# Patient Record
Sex: Female | Born: 1985 | Race: White | Hispanic: No | Marital: Married | Smoking: Never smoker
Health system: Southern US, Community
[De-identification: ages and names within clinical notes are randomized; demographics above are authoritative.]

## PROBLEM LIST (undated history)

## (undated) DIAGNOSIS — N179 Acute kidney failure, unspecified: Secondary | ICD-10-CM

## (undated) DIAGNOSIS — F419 Anxiety disorder, unspecified: Secondary | ICD-10-CM

## (undated) DIAGNOSIS — E079 Disorder of thyroid, unspecified: Secondary | ICD-10-CM

## (undated) DIAGNOSIS — N83209 Unspecified ovarian cyst, unspecified side: Secondary | ICD-10-CM

## (undated) DIAGNOSIS — F319 Bipolar disorder, unspecified: Secondary | ICD-10-CM

## (undated) DIAGNOSIS — F32A Depression, unspecified: Secondary | ICD-10-CM

## (undated) DIAGNOSIS — F329 Major depressive disorder, single episode, unspecified: Secondary | ICD-10-CM

## (undated) DIAGNOSIS — G43909 Migraine, unspecified, not intractable, without status migrainosus: Secondary | ICD-10-CM

## (undated) DIAGNOSIS — E0500 Thyrotoxicosis with diffuse goiter without thyrotoxic crisis or storm: Secondary | ICD-10-CM

## (undated) HISTORY — PX: THYROID SURGERY: SHX805

## (undated) HISTORY — PX: FRACTURE SURGERY: SHX138

## (undated) HISTORY — PX: DILATION AND CURETTAGE OF UTERUS: SHX78

## (undated) HISTORY — PX: ANKLE SURGERY: SHX546

---

## 2010-04-14 ENCOUNTER — Ambulatory Visit: Payer: Self-pay | Admitting: Unknown Physician Specialty

## 2010-10-07 ENCOUNTER — Inpatient Hospital Stay: Payer: Self-pay | Admitting: Internal Medicine

## 2010-12-02 ENCOUNTER — Emergency Department (HOSPITAL_COMMUNITY)
Admission: EM | Admit: 2010-12-02 | Discharge: 2010-12-02 | Payer: Self-pay | Source: Home / Self Care | Admitting: Emergency Medicine

## 2011-01-03 ENCOUNTER — Encounter: Payer: Self-pay | Admitting: Family Medicine

## 2011-01-07 ENCOUNTER — Observation Stay: Payer: Self-pay | Admitting: Specialist

## 2011-01-22 ENCOUNTER — Ambulatory Visit: Payer: Self-pay | Admitting: Cardiovascular Disease

## 2011-02-12 ENCOUNTER — Ambulatory Visit: Payer: Self-pay | Admitting: Internal Medicine

## 2011-03-12 ENCOUNTER — Ambulatory Visit: Payer: Self-pay | Admitting: Gastroenterology

## 2011-03-17 LAB — PATHOLOGY REPORT

## 2012-01-11 ENCOUNTER — Inpatient Hospital Stay: Payer: Self-pay | Admitting: Psychiatry

## 2012-01-11 LAB — HEPATIC FUNCTION PANEL A (ARMC)
Albumin: 4.2 g/dL (ref 3.4–5.0)
Alkaline Phosphatase: 66 U/L (ref 50–136)
SGOT(AST): 32 U/L (ref 15–37)
SGPT (ALT): 33 U/L
Total Protein: 8.8 g/dL — ABNORMAL HIGH (ref 6.4–8.2)

## 2012-01-11 LAB — CBC WITH DIFFERENTIAL/PLATELET
Basophil %: 0.7 %
Eosinophil %: 2.2 %
HCT: 41.5 % (ref 35.0–47.0)
HGB: 13.9 g/dL (ref 12.0–16.0)
Lymphocyte #: 2.6 10*3/uL (ref 1.0–3.6)
MCHC: 33.4 g/dL (ref 32.0–36.0)
Monocyte #: 0.4 10*3/uL (ref 0.0–0.7)
RDW: 13.6 % (ref 11.5–14.5)
WBC: 9 10*3/uL (ref 3.6–11.0)

## 2012-01-11 LAB — DRUG SCREEN, URINE
Amphetamines, Ur Screen: NEGATIVE (ref ?–1000)
Benzodiazepine, Ur Scrn: NEGATIVE (ref ?–200)
MDMA (Ecstasy)Ur Screen: NEGATIVE (ref ?–500)
Opiate, Ur Screen: NEGATIVE (ref ?–300)
Phencyclidine (PCP) Ur S: NEGATIVE (ref ?–25)
Tricyclic, Ur Screen: NEGATIVE (ref ?–1000)

## 2012-01-11 LAB — T4, FREE: Free Thyroxine: 0.24 ng/dL — ABNORMAL LOW (ref 0.76–1.46)

## 2012-01-11 LAB — BASIC METABOLIC PANEL
Anion Gap: 9 (ref 7–16)
Co2: 31 mmol/L (ref 21–32)
EGFR (Non-African Amer.): >60
Osmolality: 282 (ref 275–301)
Potassium: 4.3 mmol/L (ref 3.5–5.1)
Sodium: 142 mmol/L (ref 136–145)

## 2012-01-11 LAB — PREGNANCY, URINE: Pregnancy Test, Urine: NEGATIVE m[IU]/mL

## 2012-01-11 LAB — TSH: Thyroid Stimulating Horm: >100 u[IU]/mL — ABNORMAL HIGH

## 2012-01-11 LAB — ETHANOL: Ethanol: <3 mg/dL

## 2012-01-12 LAB — URINALYSIS, COMPLETE
Blood: NEGATIVE
Glucose,UR: NEGATIVE mg/dL (ref 0–75)
Nitrite: NEGATIVE
Ph: 6 (ref 4.5–8.0)
Protein: NEGATIVE
Specific Gravity: 1.013 (ref 1.003–1.030)
WBC UR: 3 /HPF (ref 0–5)

## 2012-01-12 LAB — BEHAVIORAL MEDICINE 1 PANEL
Alkaline Phosphatase: 64 U/L (ref 50–136)
Basophil #: 0.1 10*3/uL (ref 0.0–0.1)
Bilirubin,Total: 0.4 mg/dL (ref 0.2–1.0)
Calcium, Total: 8.9 mg/dL (ref 8.5–10.1)
Co2: 30 mmol/L (ref 21–32)
Creatinine: 1.12 mg/dL (ref 0.60–1.30)
EGFR (African American): >60
EGFR (Non-African Amer.): >60
Eosinophil #: 0.3 10*3/uL (ref 0.0–0.7)
Glucose: 79 mg/dL (ref 65–99)
HCT: 44.8 % (ref 35.0–47.0)
HGB: 14.9 g/dL (ref 12.0–16.0)
Lymphocyte #: 3 10*3/uL (ref 1.0–3.6)
Lymphocyte %: 38.5 %
MCV: 88 fL (ref 80–100)
Monocyte #: 0.4 10*3/uL (ref 0.0–0.7)
Neutrophil #: 4 10*3/uL (ref 1.4–6.5)
RBC: 5.11 10*6/uL (ref 3.80–5.20)
RDW: 14.1 % (ref 11.5–14.5)
SGOT(AST): 33 U/L (ref 15–37)
SGPT (ALT): 34 U/L
Sodium: 139 mmol/L (ref 136–145)
WBC: 7.8 10*3/uL (ref 3.6–11.0)

## 2012-01-21 LAB — T4, FREE: Free Thyroxine: 0.86 ng/dL (ref 0.76–1.46)

## 2012-01-21 LAB — TSH: Thyroid Stimulating Horm: 64.1 u[IU]/mL — ABNORMAL HIGH

## 2012-04-10 ENCOUNTER — Inpatient Hospital Stay: Payer: Self-pay | Admitting: Psychiatry

## 2012-04-10 LAB — COMPREHENSIVE METABOLIC PANEL
Albumin: 4.4 g/dL (ref 3.4–5.0)
Alkaline Phosphatase: 84 U/L (ref 50–136)
Anion Gap: 8 (ref 7–16)
BUN: 12 mg/dL (ref 7–18)
Bilirubin,Total: 0.4 mg/dL (ref 0.2–1.0)
Calcium, Total: 8.8 mg/dL (ref 8.5–10.1)
Chloride: 102 mmol/L (ref 98–107)
Co2: 27 mmol/L (ref 21–32)
Creatinine: 0.94 mg/dL (ref 0.60–1.30)
EGFR (African American): >60
EGFR (Non-African Amer.): >60
Glucose: 94 mg/dL (ref 65–99)
Osmolality: 273 (ref 275–301)
Potassium: 4 mmol/L (ref 3.5–5.1)
SGOT(AST): 29 U/L (ref 15–37)
SGPT (ALT): 33 U/L
Sodium: 137 mmol/L (ref 136–145)
Total Protein: 8 g/dL (ref 6.4–8.2)

## 2012-04-10 LAB — URINALYSIS, COMPLETE
Bacteria: NONE SEEN
Bilirubin,UR: NEGATIVE
Blood: NEGATIVE
Glucose,UR: NEGATIVE mg/dL (ref 0–75)
Ketone: NEGATIVE
Leukocyte Esterase: NEGATIVE
Nitrite: NEGATIVE
Ph: 5 (ref 4.5–8.0)
Protein: NEGATIVE
RBC,UR: 1 /HPF (ref 0–5)
Specific Gravity: 1.021 (ref 1.003–1.030)
Squamous Epithelial: 1
WBC UR: 5 /HPF (ref 0–5)

## 2012-04-10 LAB — CBC
HCT: 38.5 % (ref 35.0–47.0)
HGB: 12.8 g/dL (ref 12.0–16.0)
MCH: 30 pg (ref 26.0–34.0)
MCHC: 33.3 g/dL (ref 32.0–36.0)
MCV: 90 fL (ref 80–100)
Platelet: 196 10*3/uL (ref 150–440)
RBC: 4.28 10*6/uL (ref 3.80–5.20)
RDW: 14 % (ref 11.5–14.5)
WBC: 8.1 10*3/uL (ref 3.6–11.0)

## 2012-04-10 LAB — DRUG SCREEN, URINE
Amphetamines, Ur Screen: NEGATIVE (ref ?–1000)
Barbiturates, Ur Screen: NEGATIVE (ref ?–200)
Cocaine Metabolite,Ur ~~LOC~~: POSITIVE (ref ?–300)
MDMA (Ecstasy)Ur Screen: NEGATIVE (ref ?–500)
Opiate, Ur Screen: NEGATIVE (ref ?–300)
Phencyclidine (PCP) Ur S: NEGATIVE (ref ?–25)
Tricyclic, Ur Screen: NEGATIVE (ref ?–1000)

## 2012-04-10 LAB — SALICYLATE LEVEL: Salicylates, Serum: <1.7 mg/dL

## 2012-04-10 LAB — PREGNANCY, URINE: Pregnancy Test, Urine: POSITIVE m[IU]/mL

## 2012-04-10 LAB — ETHANOL: Ethanol %: <0.003 % (ref 0.000–0.080)

## 2012-04-10 LAB — TSH: Thyroid Stimulating Horm: >100 u[IU]/mL — ABNORMAL HIGH

## 2012-04-10 LAB — HCG, QUANTITATIVE, PREGNANCY: Beta Hcg, Quant.: 233 m[IU]/mL — ABNORMAL HIGH

## 2012-04-13 LAB — HCG, QUANTITATIVE, PREGNANCY: Beta Hcg, Quant.: 1058 m[IU]/mL — ABNORMAL HIGH

## 2012-04-14 LAB — URINE CULTURE

## 2012-05-09 ENCOUNTER — Encounter (HOSPITAL_COMMUNITY): Payer: Self-pay | Admitting: *Deleted

## 2012-05-09 ENCOUNTER — Emergency Department (HOSPITAL_COMMUNITY)
Admission: EM | Admit: 2012-05-09 | Discharge: 2012-05-10 | Disposition: A | Discharge status: Home / Self Care | Payer: 59 | Attending: Emergency Medicine | Admitting: Emergency Medicine

## 2012-05-09 DIAGNOSIS — E079 Disorder of thyroid, unspecified: Secondary | ICD-10-CM | POA: Insufficient documentation

## 2012-05-09 DIAGNOSIS — N898 Other specified noninflammatory disorders of vagina: Secondary | ICD-10-CM | POA: Insufficient documentation

## 2012-05-09 DIAGNOSIS — N939 Abnormal uterine and vaginal bleeding, unspecified: Secondary | ICD-10-CM

## 2012-05-09 HISTORY — DX: Disorder of thyroid, unspecified: E07.9

## 2012-05-09 MED ORDER — ONDANSETRON HCL 4 MG/2ML IJ SOLN
4.0000 mg | Freq: Once | INTRAMUSCULAR | Status: AC
  Administered 2012-05-10: 4 mg via INTRAVENOUS
  Filled 2012-05-09: qty 2

## 2012-05-09 MED ORDER — HYDROMORPHONE HCL PF 1 MG/ML IJ SOLN
1.0000 mg | Freq: Once | INTRAMUSCULAR | Status: AC
  Administered 2012-05-10: 1 mg via INTRAVENOUS
  Filled 2012-05-09: qty 1

## 2012-05-09 MED ORDER — SODIUM CHLORIDE 0.9 % IV SOLN
INTRAVENOUS | Status: DC
  Administered 2012-05-10: via INTRAVENOUS

## 2012-05-09 NOTE — ED Provider Notes (Signed)
History     CSN: [id number]  Arrival date & time 05/09/12  2137   None     Chief Complaint  Patient presents with  . Vaginal Bleeding    (Consider location/radiation/quality/duration/timing/severity/associated sxs/prior treatment) HPI Comments: Pt states she had just found out that she was [redacted] weeks pregnant last week when she and her husband went to visit friend in [place].  She began having heavy bleeding while there and saw a private GYN MD who performed a D&C.  She was told she would have bleeding for another couple days but has bled heavily x 5 days.  Estimates saturating ~ 40 pads.  She has crampy pelvic pain and has intermittent fevers of > 101 orally.  G2P1A1.  Patient is a 26 y.o. female presenting with vaginal bleeding. The history is provided by the patient. No language interpreter was used.  Vaginal Bleeding This is a new problem. Episode onset: 5 days ago. The problem occurs constantly. The problem has been unchanged. Associated symptoms include abdominal pain and chills. The symptoms are aggravated by nothing.    Past Medical History  Diagnosis Date  . Thyroid disease     Past Surgical History  Procedure Date  . Thyroid surgery   . Fracture surgery     History reviewed. No pertinent family history.  History  Substance Use Topics  . Smoking status: Never Smoker   . Smokeless tobacco: Not on file  . Alcohol Use: Yes    OB History    Grav Para Term Preterm Abortions TAB SAB Ect Mult Living                  Review of Systems  Constitutional: Positive for chills.  Gastrointestinal: Positive for abdominal pain.  Genitourinary: Positive for vaginal bleeding and pelvic pain.  All other systems reviewed and are negative.    Allergies  Aleve and Iodine  Home Medications   Current Outpatient Rx  Name Route Sig Dispense Refill  . ACETAMINOPHEN 500 MG PO TABS Oral Take 1,000 mg by mouth as needed.    Marland Kitchen HYDROCODONE-IBUPROFEN 7.5-200 MG PO TABS Oral  Take 1 tablet by mouth every 8 (eight) hours as needed.    . IBUPROFEN 200 MG PO TABS Oral Take 400 mg by mouth as needed. FOR PAIN    . PARAGARD INTRAUTERINE COPPER IU Intrauterine by Intrauterine route.    Marland Kitchen LEVOTHYROXINE SODIUM 200 MCG PO TABS Oral Take 200 mcg by mouth daily.      BP 121/98  Pulse 76  Temp 98 F (36.7 C)  Resp 20  Ht 5\' 7"  (1.702 m)  Wt 190 lb (86.183 kg)  BMI 29.76 kg/m2  SpO2 100%  LMP 01/10/2012  Physical Exam  Nursing note and vitals reviewed. Constitutional: She is oriented to person, place, and time. She appears well-developed and well-nourished. No distress.  HENT:  Head: Normocephalic and atraumatic.  Eyes: EOM are normal.  Neck: Normal range of motion.  Cardiovascular: Normal rate, regular rhythm and normal heart sounds.   Pulmonary/Chest: Effort normal and breath sounds normal.  Abdominal: Soft. She exhibits no distension. There is no tenderness.  Genitourinary: There is no rash, tenderness, lesion or injury on the right labia. There is no rash, tenderness, lesion or injury on the left labia. There is bleeding around the vagina. No foreign body around the vagina. No signs of injury around the vagina. No vaginal discharge found.       No active bleeding noted.  Diffuse  tenderness on bimanual exam no particular area of pain or tenderness although L adnexa is a little more tender than the R  Musculoskeletal: Normal range of motion.  Neurological: She is alert and oriented to person, place, and time.  Skin: Skin is warm and dry.  Psychiatric: She has a normal mood and affect. Judgment normal.    ED Course  Procedures (including critical care time)  Labs Reviewed  CBC - Abnormal; Notable for the following:    HCT 35.0 (*)    All other components within normal limits  BASIC METABOLIC PANEL - Abnormal; Notable for the following:    Potassium 3.2 (*)    Glucose, Bld 107 (*)    All other components within normal limits  HCG, QUANTITATIVE, PREGNANCY  - Abnormal; Notable for the following:    hCG, Beta Chain, Quant, S 2684 (*)    All other components within normal limits  DIFFERENTIAL  ABO/RH  WET PREP, GENITAL  GC/CHLAMYDIA PROBE AMP, GENITAL  RPR   No results found.   1. Vaginal bleeding       MDM  Discussed with dr. Emelda Fear.  He will see in the office at 0900 today.  He can also do the ultrasouund there.  Have discussed lab findings and follow up  With the pt.  She understands and agrees.        Worthy Rancher, PA 05/10/12 [phone]

## 2012-05-09 NOTE — ED Notes (Signed)
Vag bleeding, fever, chills, low abd pain. Had miscarriage and D&C 5/23.in [place].

## 2012-05-10 ENCOUNTER — Ambulatory Visit (HOSPITAL_COMMUNITY): Payer: 59 | Admitting: Anesthesiology

## 2012-05-10 ENCOUNTER — Ambulatory Visit (HOSPITAL_COMMUNITY)
Admission: RE | Admit: 2012-05-10 | Discharge: 2012-05-10 | Disposition: A | Discharge status: Home / Self Care | Payer: 59 | Source: Ambulatory Visit | Attending: Obstetrics and Gynecology | Admitting: Obstetrics and Gynecology

## 2012-05-10 ENCOUNTER — Encounter (HOSPITAL_COMMUNITY)
Admission: RE | Disposition: A | Discharge status: Home / Self Care | Payer: Self-pay | Source: Ambulatory Visit | Attending: Obstetrics and Gynecology

## 2012-05-10 ENCOUNTER — Encounter (HOSPITAL_COMMUNITY): Payer: Self-pay | Admitting: *Deleted

## 2012-05-10 ENCOUNTER — Encounter (HOSPITAL_COMMUNITY): Payer: Self-pay | Admitting: Anesthesiology

## 2012-05-10 DIAGNOSIS — O034 Incomplete spontaneous abortion without complication: Secondary | ICD-10-CM | POA: Insufficient documentation

## 2012-05-10 DIAGNOSIS — Z30432 Encounter for removal of intrauterine contraceptive device: Secondary | ICD-10-CM | POA: Insufficient documentation

## 2012-05-10 HISTORY — PX: IUD REMOVAL: SHX5392

## 2012-05-10 HISTORY — PX: DILATION AND CURETTAGE OF UTERUS: SHX78

## 2012-05-10 HISTORY — DX: Thyrotoxicosis with diffuse goiter without thyrotoxic crisis or storm: E05.00

## 2012-05-10 LAB — WET PREP, GENITAL
Trich, Wet Prep: NONE SEEN
Yeast Wet Prep HPF POC: NONE SEEN

## 2012-05-10 LAB — DIFFERENTIAL
Eosinophils Absolute: 0.1 10*3/uL (ref 0.0–0.7)
Eosinophils Relative: 2 % (ref 0–5)
Lymphocytes Relative: 35 % (ref 12–46)
Lymphs Abs: 2.8 10*3/uL (ref 0.7–4.0)
Monocytes Relative: 5 % (ref 3–12)
Neutrophils Relative %: 57 % (ref 43–77)

## 2012-05-10 LAB — BASIC METABOLIC PANEL
BUN: 6 mg/dL (ref 6–23)
CO2: 23 mEq/L (ref 19–32)
GFR calc non Af Amer: >90 mL/min (ref >90)
Glucose, Bld: 107 mg/dL — ABNORMAL HIGH (ref 70–99)
Potassium: 3.2 mEq/L — ABNORMAL LOW (ref 3.5–5.1)

## 2012-05-10 LAB — SURGICAL PCR SCREEN
MRSA, PCR: NEGATIVE
Staphylococcus aureus: NEGATIVE

## 2012-05-10 LAB — CBC
Hemoglobin: 12 g/dL (ref 12.0–15.0)
MCH: 29.9 pg (ref 26.0–34.0)
MCV: 87.3 fL (ref 78.0–100.0)
RBC: 4.01 MIL/uL (ref 3.87–5.11)

## 2012-05-10 SURGERY — DILATION AND CURETTAGE
Anesthesia: General | Site: Uterus | Wound class: Clean Contaminated

## 2012-05-10 MED ORDER — FENTANYL CITRATE 0.05 MG/ML IJ SOLN
INTRAMUSCULAR | Status: DC | PRN
  Administered 2012-05-10: 50 ug via INTRAVENOUS
  Administered 2012-05-10: 100 ug via INTRAVENOUS
  Administered 2012-05-10: 50 ug via INTRAVENOUS

## 2012-05-10 MED ORDER — FENTANYL CITRATE 0.05 MG/ML IJ SOLN
INTRAMUSCULAR | Status: AC
  Administered 2012-05-10: 50 ug via INTRAVENOUS
  Filled 2012-05-10: qty 2

## 2012-05-10 MED ORDER — ONDANSETRON HCL 4 MG/2ML IJ SOLN
INTRAMUSCULAR | Status: AC
  Administered 2012-05-10: 4 mg via INTRAVENOUS
  Filled 2012-05-10: qty 2

## 2012-05-10 MED ORDER — PROPOFOL 10 MG/ML IV EMUL
INTRAVENOUS | Status: AC
  Filled 2012-05-10: qty 20

## 2012-05-10 MED ORDER — LACTATED RINGERS IV SOLN
INTRAVENOUS | Status: DC | PRN
  Administered 2012-05-10: 13:00:00 via INTRAVENOUS

## 2012-05-10 MED ORDER — DEXTROSE 5 % IV SOLN: 1.0000 g | Freq: Once | INTRAVENOUS | Status: DC

## 2012-05-10 MED ORDER — ONDANSETRON HCL 4 MG/2ML IJ SOLN
4.0000 mg | Freq: Once | INTRAMUSCULAR | Status: AC
  Administered 2012-05-10: 4 mg via INTRAVENOUS

## 2012-05-10 MED ORDER — MUPIROCIN 2 % EX OINT
TOPICAL_OINTMENT | Freq: Two times a day (BID) | CUTANEOUS | Status: DC
  Administered 2012-05-10: 2 via NASAL

## 2012-05-10 MED ORDER — METHYLERGONOVINE MALEATE 0.2 MG/ML IJ SOLN
INTRAMUSCULAR | Status: AC
  Filled 2012-05-10: qty 1

## 2012-05-10 MED ORDER — MIDAZOLAM HCL 5 MG/5ML IJ SOLN
INTRAMUSCULAR | Status: DC | PRN
  Administered 2012-05-10: 2 mg via INTRAVENOUS

## 2012-05-10 MED ORDER — BUPIVACAINE-EPINEPHRINE PF 0.5-1:200000 % IJ SOLN
INTRAMUSCULAR | Status: AC
  Filled 2012-05-10: qty 10

## 2012-05-10 MED ORDER — DEXTROSE 5 % IV SOLN
INTRAVENOUS | Status: AC
  Filled 2012-05-10: qty 10

## 2012-05-10 MED ORDER — MUPIROCIN 2 % EX OINT
TOPICAL_OINTMENT | CUTANEOUS | Status: AC
  Administered 2012-05-10: 2 via NASAL
  Filled 2012-05-10: qty 22

## 2012-05-10 MED ORDER — HYDROCODONE-ACETAMINOPHEN 5-325 MG PO TABS
ORAL_TABLET | ORAL | Status: AC
  Filled 2012-05-10: qty 1

## 2012-05-10 MED ORDER — FENTANYL CITRATE 0.05 MG/ML IJ SOLN
25.0000 ug | INTRAMUSCULAR | Status: DC | PRN
  Administered 2012-05-10 (×4): 50 ug via INTRAVENOUS

## 2012-05-10 MED ORDER — LACTATED RINGERS IV SOLN
INTRAVENOUS | Status: DC
  Administered 2012-05-10: 1000 mL via INTRAVENOUS

## 2012-05-10 MED ORDER — DOXYCYCLINE HYCLATE 100 MG PO CAPS: 100.0000 mg | ORAL_CAPSULE | Freq: Two times a day (BID) | ORAL | Status: AC

## 2012-05-10 MED ORDER — HYDROCODONE-ACETAMINOPHEN 5-325 MG PO TABS
1.0000 | ORAL_TABLET | Freq: Once | ORAL | Status: AC
  Administered 2012-05-10: 1 via ORAL

## 2012-05-10 MED ORDER — DEXTROSE 5 % IV SOLN
1.0000 g | INTRAVENOUS | Status: DC | PRN
  Administered 2012-05-10: 1 g via INTRAVENOUS

## 2012-05-10 MED ORDER — CEFTRIAXONE SODIUM 1 G IJ SOLR
1.0000 g | Freq: Once | INTRAMUSCULAR | Status: AC
  Administered 2012-05-10: 1 g via INTRAMUSCULAR
  Filled 2012-05-10: qty 10

## 2012-05-10 MED ORDER — OXYCODONE-ACETAMINOPHEN 5-325 MG PO TABS: 1.0000 | ORAL_TABLET | ORAL | Status: AC | PRN

## 2012-05-10 MED ORDER — SODIUM CHLORIDE 0.9 % IR SOLN
Status: DC | PRN
  Administered 2012-05-10: 1000 mL

## 2012-05-10 MED ORDER — ONDANSETRON HCL 4 MG/2ML IJ SOLN
4.0000 mg | Freq: Once | INTRAMUSCULAR | Status: AC | PRN
  Administered 2012-05-10: 4 mg via INTRAVENOUS

## 2012-05-10 MED ORDER — AZITHROMYCIN 250 MG PO TABS
500.0000 mg | ORAL_TABLET | Freq: Once | ORAL | Status: AC
  Administered 2012-05-10: 500 mg via ORAL
  Filled 2012-05-10: qty 2

## 2012-05-10 MED ORDER — MIDAZOLAM HCL 2 MG/2ML IJ SOLN
INTRAMUSCULAR | Status: AC
  Administered 2012-05-10: 2 mg via INTRAVENOUS
  Filled 2012-05-10: qty 2

## 2012-05-10 MED ORDER — PROPOFOL 10 MG/ML IV EMUL
INTRAVENOUS | Status: DC | PRN
  Administered 2012-05-10: 200 mg via INTRAVENOUS

## 2012-05-10 MED ORDER — MIDAZOLAM HCL 2 MG/2ML IJ SOLN
INTRAMUSCULAR | Status: AC
  Filled 2012-05-10: qty 2

## 2012-05-10 MED ORDER — ONDANSETRON HCL 4 MG/2ML IJ SOLN
INTRAMUSCULAR | Status: AC
  Filled 2012-05-10: qty 2

## 2012-05-10 MED ORDER — LIDOCAINE HCL (CARDIAC) 10 MG/ML IV SOLN
INTRAVENOUS | Status: DC | PRN
  Administered 2012-05-10: 50 mg via INTRAVENOUS

## 2012-05-10 MED ORDER — LIDOCAINE HCL (PF) 1 % IJ SOLN
INTRAMUSCULAR | Status: AC
  Filled 2012-05-10: qty 5

## 2012-05-10 MED ORDER — MIDAZOLAM HCL 2 MG/2ML IJ SOLN
1.0000 mg | INTRAMUSCULAR | Status: DC | PRN
  Administered 2012-05-10: 2 mg via INTRAVENOUS

## 2012-05-10 MED ORDER — LIDOCAINE HCL (PF) 1 % IJ SOLN
INTRAMUSCULAR | Status: AC
  Filled 2012-05-10: qty 30

## 2012-05-10 SURGICAL SUPPLY — 27 items
BAG HAMPER (MISCELLANEOUS) ×3 IMPLANT
CATH ROBINSON RED A/P 14FR (CATHETERS) IMPLANT
CLOTH BEACON ORANGE TIMEOUT ST (SAFETY) ×3 IMPLANT
COVER LIGHT HANDLE STERIS (MISCELLANEOUS) ×6 IMPLANT
CURETTE VACUUM 9MM CVD CLR (CANNULA) IMPLANT
DECANTER SPIKE VIAL GLASS SM (MISCELLANEOUS) ×3 IMPLANT
FORMALIN 10 PREFIL 480ML (MISCELLANEOUS) ×3 IMPLANT
GAUZE SPONGE 4X4 16PLY XRAY LF (GAUZE/BANDAGES/DRESSINGS) ×3 IMPLANT
GLOVE ECLIPSE 9.0 STRL (GLOVE) ×3 IMPLANT
GLOVE INDICATOR STER SZ 9 (GLOVE) ×3 IMPLANT
GOWN STRL REIN 3XL LVL4 (GOWN DISPOSABLE) ×3 IMPLANT
GOWN STRL REIN XL XLG (GOWN DISPOSABLE) ×6 IMPLANT
KIT BERKELEY 1ST TRIMESTER 3/8 (MISCELLANEOUS) ×3 IMPLANT
KIT ROOM TURNOVER AP CYSTO (KITS) ×3 IMPLANT
MARKER SKIN DUAL TIP RULER LAB (MISCELLANEOUS) ×3 IMPLANT
NEEDLE SPNL 22GX3.5 QUINCKE BK (NEEDLE) IMPLANT
NS IRRIG 1000ML POUR BTL (IV SOLUTION) ×3 IMPLANT
PACK BASIC III (CUSTOM PROCEDURE TRAY) ×1
PACK SRG BSC III STRL LF ECLPS (CUSTOM PROCEDURE TRAY) ×2 IMPLANT
PAD ARMBOARD 7.5X6 YLW CONV (MISCELLANEOUS) ×3 IMPLANT
SET BERKELEY SUCTION TUBING (SUCTIONS) ×3 IMPLANT
SYR CONTROL 10ML LL (SYRINGE) IMPLANT
TOWEL OR 17X26 4PK STRL BLUE (TOWEL DISPOSABLE) ×3 IMPLANT
VACURETTE 10MM (CANNULA) IMPLANT
VACURETTE 12MM (CANNULA) IMPLANT
VACURETTE 14MM (CANNULA) IMPLANT
VACURETTE 8MM (CANNULA) ×3 IMPLANT

## 2012-05-10 NOTE — Op Note (Signed)
See operative details included in brief operative note 

## 2012-05-10 NOTE — Discharge Instructions (Signed)
Dr. Emelda Fear will see you in his office at 0900 in the morning.  He can also do the ultrasound while there.

## 2012-05-10 NOTE — H&P (Signed)
Carolyn [name] is an 26 y.o. female. She is admitted for suction dilation and curettage for incomplete abortion. She also has removal of IUD planned. On 05/04/2012 she underwent a suction D&C at with placement of IUD in what she describes as a miscarriage. The procedure was described as successful. I spoke with the physician was uneventful procedure however she's been running a fever with temperature 102.4. [facility], and low-grade fever. She was given Rocephin intravenously as well as azithromycin intravenously. He had a temperature to 1024 at home, with temperature 90.9 1.1 this morning. Nonetheless the white count was 9 was normal and nonpurulent. These Patient declines consideration of IUD removal antibiotic therapy and following her for resolution of the incomplete AB. Plans are to proceed with repeat suction dilation curettage and IUD removal with a procedure antibiotics. Patient is aware of the alternatives and declines these alternatives risks of the procedure including Asherman syndrome with the uterine adhesions have been discussed   Pertinent Gynecological History: Menses: Status post suction D&C 523 Bleeding: sense procedure  Contraception: IUD DES exposure: unknown Blood transfusions: none Sexually transmitted diseases: no past history Previous GYN Procedures: DNC  Last mammogram: Date: Not applicable  Last pap:  Date:  OB History: G1, P0010   Menstrual History: Menarche age:  Patient's last menstrual period was 01/10/2012.    Past Medical History  Diagnosis Date  . Thyroid disease   . Grave's disease     Past Surgical History  Procedure Date  . Thyroid surgery   . Fracture surgery   . Dilation and curettage of uterus     Family History  Problem Relation Age of Onset  . Anesthesia problems Neg Hx   . Hypotension Neg Hx   . Malignant hyperthermia Neg Hx   . Pseudochol deficiency Neg Hx      Social History:  reports that she has never smoked. She does not have any smokeless tobacco history on file. She reports that she drinks alcohol. She reports that she does not use illicit drugs.  Allergies:  Allergies  Allergen Reactions  . Aleve (Naproxen Sodium) Nausea And Vomiting    'makes headaches worse"  . Iodine Other (See Comments)    Causes blackouts    Prescriptions prior to admission  Medication Sig Dispense Refill  . acetaminophen (TYLENOL) 500 MG tablet Take 1,000 mg by mouth as needed.      Marland Kitchen HYDROcodone-ibuprofen (VICOPROFEN) 7.5-200 MG per tablet Take 1 tablet by mouth every 8 (eight) hours as needed.      Marland Kitchen ibuprofen (ADVIL,MOTRIN) 200 MG tablet Take 400 mg by mouth as needed. FOR PAIN      . IUD's (PARAGARD INTRAUTERINE COPPER IU) by Intrauterine route.      Marland Kitchen levothyroxine (SYNTHROID, LEVOTHROID) 200 MCG tablet Take 200 mcg by mouth daily.        Review of Systems  Constitutional: Positive for fever.  Eyes: Negative.   Respiratory: Negative.   Cardiovascular: Negative.   Genitourinary: Negative.  Negative for dysuria.       Ultrasound shows a 1.5 cm thickened heterogenous thickened endometrium, with IUD in place in center of uterine cavity. Slight cul de sac fluid. Adnexa normal Uterus tender to contact with vag probe.  Skin: Negative for rash.    Blood pressure 125/84, pulse 65, temperature 98.2 F (36.8 C), temperature source Oral, resp. rate 16, height 5\' 7"  (1.702 m), weight 86.183 kg (190  lb), last menstrual period 01/10/2012, SpO2 98.00%. Physical Exam  Constitutional: She is oriented to person, place, and time. She appears well-developed and well-nourished.  HENT:  Head: Atraumatic.  Eyes: Pupils are equal, round, and reactive to light.  Neck: Neck supple.  Cardiovascular: Normal rate and regular rhythm.   Respiratory: Effort normal and breath sounds normal.  Genitourinary: Vagina normal.       Tender uterus, anteflexed., with thickened  endometrium. Intrauterine iud.  Adnexa normal Slight cul de sac fluid.  Musculoskeletal: Normal range of motion.  Neurological: She is alert and oriented to person, place, and time. She has normal reflexes.  Psychiatric: She has a normal mood and affect. Her behavior is normal. Thought content normal.    Results for orders placed during the hospital encounter of 05/09/12 (from the past 24 hour(s))  HCG, QUANTITATIVE, PREGNANCY     Status: Abnormal   Collection Time   05/09/12 11:46 PM      Component Value Range   hCG, Beta Chain, Quant, S 2684 (*) <5 (mIU/mL)  CBC     Status: Abnormal   Collection Time   05/09/12 11:48 PM      Component Value Range   WBC 7.9  4.0 - 10.5 (K/uL)   RBC 4.01  3.87 - 5.11 (MIL/uL)   Hemoglobin 12.0  12.0 - 15.0 (g/dL)   HCT 16.1 (*) 09.6 - 46.0 (%)   MCV 87.3  78.0 - 100.0 (fL)   MCH 29.9  26.0 - 34.0 (pg)   MCHC 34.3  30.0 - 36.0 (g/dL)   RDW 04.5  40.9 - 81.1 (%)   Platelets 171  150 - 400 (K/uL)  DIFFERENTIAL     Status: Normal   Collection Time   05/09/12 11:48 PM      Component Value Range   Neutrophils Relative 57  43 - 77 (%)   Neutro Abs 4.6  1.7 - 7.7 (K/uL)   Lymphocytes Relative 35  12 - 46 (%)   Lymphs Abs 2.8  0.7 - 4.0 (K/uL)   Monocytes Relative 5  3 - 12 (%)   Monocytes Absolute 0.4  0.1 - 1.0 (K/uL)   Eosinophils Relative 2  0 - 5 (%)   Eosinophils Absolute 0.1  0.0 - 0.7 (K/uL)   Basophils Relative 1  0 - 1 (%)   Basophils Absolute 0.0  0.0 - 0.1 (K/uL)  BASIC METABOLIC PANEL     Status: Abnormal   Collection Time   05/09/12 11:48 PM      Component Value Range   Sodium 139  135 - 145 (mEq/L)   Potassium 3.2 (*) 3.5 - 5.1 (mEq/L)   Chloride 105  96 - 112 (mEq/L)   CO2 23  19 - 32 (mEq/L)   Glucose, Bld 107 (*) 70 - 99 (mg/dL)   BUN 6  6 - 23 (mg/dL)   Creatinine, Ser 9.14  0.50 - 1.10 (mg/dL)   Calcium 9.7  8.4 - 78.2 (mg/dL)   GFR calc non Af Amer >90  >90 (mL/min)   GFR calc Af Amer >90  >90 (mL/min)  ABO/RH      Status: Normal   Collection Time   05/09/12 11:49 PM      Component Value Range   ABO/RH(D) O POS    WET PREP, GENITAL     Status: Abnormal   Collection Time   05/10/12  1:36 AM      Component Value Range   Yeast Wet Prep HPF  POC NONE SEEN  NONE SEEN    Trich, Wet Prep NONE SEEN  NONE SEEN    Clue Cells Wet Prep HPF POC FEW (*) NONE SEEN    WBC, Wet Prep HPF POC FEW (*) NONE SEEN     No results found.  Assessment/Plan:Incomplete abortion Postoperative abortion endometritis IUD to be removed  Carolyn [name] V 05/10/2012, 12:33 PM

## 2012-05-10 NOTE — Discharge Instructions (Signed)
Dilation and Curettage or Vacuum Curettage  Care After  Refer to this sheet in the next few weeks. These instructions provide you with general information on caring for yourself after your procedure. Your caregiver may also give you more specific instructions. Your treatment has been planned according to current medical practices, but problems sometimes occur. Call your caregiver if you have any problems or questions after your procedure.  HOME CARE INSTRUCTIONS   · Do not drive for 24 hours.  · Wait 1 week before returning to strenuous activities.  · Take your temperature 2 times a day for 4 days and write it down. Provide these temperatures to your caregiver if you develop a fever.  · Avoid long periods of standing, and do no heavy lifting (more than 10 pounds or 4.5 kg), pushing, or pulling.  · Limit stair climbing to once or twice a day.  · Take rest periods often.  · You may resume your usual diet.  · Drink enough fluids to keep your urine clear or pale yellow.  · You should return to your usual bowel function. If constipation should occur, you may:  · Take a mild laxative with permission from your caregiver.  · Add fruit and bran to your diet.  · Drink more fluids.  · Take showers instead of baths until your caregiver gives you permission to take baths.  · Do not go swimming or use a hot tub until your caregiver gives you permission.  · Try to have someone with you or available to you the first 24 to 48 hours, especially if you had a general anesthetic.  · Do not douche, use tampons, or have intercourse until after your follow-up appointment, or when your caregiver approves.  · Only take over-the-counter or prescription medicines for pain, discomfort, or fever as directed by your caregiver. Do not take aspirin. It can cause bleeding.  · If a prescription was given, follow your caregiver's directions.  · Keep all your follow-up appointments recommended by your caregiver.  SEEK MEDICAL CARE IF:   · You have  increasing cramps or pain not relieved with medicine.  · You have abdominal pain which does not seem to be related to the same area of earlier cramping and pain.  · You have bad smelling vaginal discharge.  · You have a rash.  · You have problems with any medicine.  SEEK IMMEDIATE MEDICAL CARE IF:   · You have bleeding that is heavier than a normal menstrual period.  · You have a fever.  · You have chest pain.  · You have shortness of breath.  · You feel dizzy or feel like fainting.  · You pass out.  · You have pain in your shoulder strap area.  · You have heavy vaginal bleeding with or without blood clots.  MAKE SURE YOU:   · Understand these instructions.  · Will watch your condition.  · Will get help right away if you are not doing well or get worse.  Document Released: 11/26/2000 Document Revised: 11/18/2011 Document Reviewed: 06/26/2009  [employer]® Patient Information ©2012 [employer].

## 2012-05-10 NOTE — Anesthesia Postprocedure Evaluation (Signed)
  Anesthesia Post-op Note  Patient: Carolyn [name] [place]  Procedure(s) Performed: Procedure(s) (LRB): DILATATION AND CURETTAGE (N/A)  Patient Location: PACU  Anesthesia Type: General  Level of Consciousness: awake, alert , oriented and patient cooperative  Airway and Oxygen Therapy: Patient Spontanous Breathing and Patient connected to face mask oxygen  Post-op Pain: mild  Post-op Assessment: Post-op Vital signs reviewed, Patient's Cardiovascular Status Stable, Respiratory Function Stable, Patent Airway and No signs of Nausea or vomiting  Post-op Vital Signs: Reviewed and stable  Complications: No apparent anesthesia complications

## 2012-05-10 NOTE — Brief Op Note (Signed)
05/10/2012  2:02 PM  PATIENT:  Carolyn [name]  26 y.o. female  PRE-OPERATIVE DIAGNOSIS:  Incomplete Abortion  POST-OPERATIVE DIAGNOSIS:  Incomplete Abortion  PROCEDURE:  Procedure(s) (LRB): DILATATION AND CURETTAGE (N/A),  Removal of IUD (copper)  SURGEON:  Surgeon(s) and Role:    * Tilda Burrow, MD - Primary  PHYSICIAN ASSISTANT:   ASSISTANTS: none   ANESTHESIA:   IV sedation and MAC  EBL:   25cc  BLOOD ADMINISTERED:none  DRAINS: none   LOCAL MEDICATIONS USED:  NONE  SPECIMEN:  Source of Specimen:  Products of conception  DISPOSITION OF SPECIMEN:  PATHOLOGY  COUNTS:  YES  TOURNIQUET:  * No tourniquets in log *  DICTATION: .Dragon Dictation Patient was taken operating room prepped and draped for vaginal procedure with perineum prepped and draped in timeout conducted and procedure confirmed by surgical team. Speculum was inserted cervix grasped with single-tooth tenaculum, and the IUD string used to extract the copper IUD intact. Cervix was then sounded in the anteflexed position to 8 cm, dilated to 27 [place] allowed introduction of the curved 8 mm suction curette which was then used in a circumferential fashion under 50 mmHg to achieve removal of blood clots and tissue remnants with incomplete AB. There was a slight mucus-containing, possibly infected appearance to some of the tissue, but mostly blood and clots and tissue. Curettage was briefly performed in all 4 quadrants to confirm satisfactory uterine evacuation and then patient allowed to go recovery in good condition sponge and needle counts correct. blood type is confirmed as Rh+.  PLAN OF CARE: Discharge to home after PACU  PATIENT DISPOSITION:  PACU - hemodynamically stable.   Delay start of Pharmacological VTE agent (>24hrs) due to surgical blood loss or risk of bleeding: not applicable

## 2012-05-10 NOTE — Transfer of Care (Signed)
Immediate Anesthesia Transfer of Care Note  Patient: Carolyn [name]  Procedure(s) Performed: Procedure(s) (LRB): DILATATION AND CURETTAGE (N/A)  Patient Location: PACU  Anesthesia Type: General  Level of Consciousness: awake, alert , oriented and patient cooperative  Airway & Oxygen Therapy: Patient Spontanous Breathing and Patient connected to face mask oxygen  Post-op Assessment: Report given to PACU RN and Post -op Vital signs reviewed and stable  Post vital signs: Reviewed and stable  Complications: No apparent anesthesia complications

## 2012-05-10 NOTE — Anesthesia Preprocedure Evaluation (Signed)
Anesthesia Evaluation  Patient identified by MRN, date of birth, ID band Patient awake    Reviewed: Allergy & Precautions, H&P , NPO status , Patient's Chart, lab work & pertinent test results  History of Anesthesia Complications Negative for: history of anesthetic complications  Airway Mallampati: I TM Distance: >3 FB     Dental  (+) Teeth Intact   Pulmonary neg pulmonary ROS,  breath sounds clear to auscultation        Cardiovascular negative cardio ROS  Rhythm:Regular Rate:Normal     Neuro/Psych    GI/Hepatic   Endo/Other  Hyperthyroidism (thyroidectomy for Grave's Dx)   Renal/GU      Musculoskeletal   Abdominal   Peds  Hematology   Anesthesia Other Findings   Reproductive/Obstetrics                           Anesthesia Physical Anesthesia Plan  ASA: II  Anesthesia Plan: General   Post-op Pain Management:    Induction: Intravenous  Airway Management Planned: LMA  Additional Equipment:   Intra-op Plan:   Post-operative Plan: Extubation in OR  Informed Consent: I have reviewed the patients History and Physical, chart, labs and discussed the procedure including the risks, benefits and alternatives for the proposed anesthesia with the patient or authorized representative who has indicated his/her understanding and acceptance.     Plan Discussed with:   Anesthesia Plan Comments:         Anesthesia Quick Evaluation

## 2012-05-10 NOTE — Preoperative (Signed)
Beta Blockers   Reason not to administer Beta Blockers:Not Applicable 

## 2012-05-10 NOTE — Anesthesia Procedure Notes (Signed)
Procedure Name: LMA Insertion Date/Time: 05/10/2012 1:38 PM Performed by: Carolyne Littles, Malyn Aytes L Pre-anesthesia Checklist: Patient identified, Timeout performed, Emergency Drugs available, Suction available and Patient being monitored Patient Re-evaluated:Patient Re-evaluated prior to inductionOxygen Delivery Method: Circle system utilized Preoxygenation: Pre-oxygenation with 100% oxygen Intubation Type: IV induction Ventilation: Mask ventilation without difficulty LMA: LMA inserted LMA Size: 3.0 Placement Confirmation: positive ETCO2 and breath sounds checked- equal and bilateral Tube secured with: Tape Dental Injury: Teeth and Oropharynx as per pre-operative assessment

## 2012-05-11 LAB — GC/CHLAMYDIA PROBE AMP, GENITAL: GC Probe Amp, Genital: NEGATIVE

## 2012-05-11 MED ORDER — MIDAZOLAM HCL 2 MG/2ML IJ SOLN
INTRAMUSCULAR | Status: AC
  Filled 2012-05-11: qty 2

## 2012-05-11 NOTE — ED Provider Notes (Signed)
Medical screening examination/treatment/procedure(s) were performed by non-physician practitioner and as supervising physician I was immediately available for consultation/collaboration.  Nicoletta Dress. Colon Branch, MD 05/11/12 2222

## 2012-05-12 ENCOUNTER — Encounter (HOSPITAL_COMMUNITY): Payer: Self-pay | Admitting: Obstetrics and Gynecology

## 2012-05-12 NOTE — ED Notes (Signed)
+  Chlamydia. Patient treated with Rocephin and Zithromax. Per protocol MD. [employer] faxed. °

## 2012-05-13 NOTE — ED Notes (Signed)
Left message for patient to call back  

## 2012-05-14 NOTE — ED Notes (Signed)
Patient returned call, notified of + chlamydia after ID verified x two. STD instructions given. Patient verbalized understanding.

## 2012-05-14 NOTE — ED Notes (Signed)
Attempt to call patient, left voicemail to call flow manager's #

## 2013-05-21 ENCOUNTER — Emergency Department (HOSPITAL_COMMUNITY): Payer: Medicaid Other

## 2013-05-21 ENCOUNTER — Emergency Department (HOSPITAL_COMMUNITY)
Admission: EM | Admit: 2013-05-21 | Discharge: 2013-05-21 | Disposition: A | Discharge status: Home / Self Care | Payer: Medicaid Other | Attending: Emergency Medicine | Admitting: Emergency Medicine

## 2013-05-21 ENCOUNTER — Encounter (HOSPITAL_COMMUNITY): Payer: Self-pay

## 2013-05-21 DIAGNOSIS — Z3490 Encounter for supervision of normal pregnancy, unspecified, unspecified trimester: Secondary | ICD-10-CM

## 2013-05-21 DIAGNOSIS — R109 Unspecified abdominal pain: Secondary | ICD-10-CM | POA: Insufficient documentation

## 2013-05-21 DIAGNOSIS — O200 Threatened abortion: Secondary | ICD-10-CM

## 2013-05-21 DIAGNOSIS — O34599 Maternal care for other abnormalities of gravid uterus, unspecified trimester: Secondary | ICD-10-CM | POA: Insufficient documentation

## 2013-05-21 DIAGNOSIS — N83209 Unspecified ovarian cyst, unspecified side: Secondary | ICD-10-CM

## 2013-05-21 DIAGNOSIS — E079 Disorder of thyroid, unspecified: Secondary | ICD-10-CM | POA: Insufficient documentation

## 2013-05-21 DIAGNOSIS — Z862 Personal history of diseases of the blood and blood-forming organs and certain disorders involving the immune mechanism: Secondary | ICD-10-CM | POA: Insufficient documentation

## 2013-05-21 DIAGNOSIS — Z79899 Other long term (current) drug therapy: Secondary | ICD-10-CM | POA: Insufficient documentation

## 2013-05-21 DIAGNOSIS — Z8639 Personal history of other endocrine, nutritional and metabolic disease: Secondary | ICD-10-CM | POA: Insufficient documentation

## 2013-05-21 DIAGNOSIS — R110 Nausea: Secondary | ICD-10-CM | POA: Insufficient documentation

## 2013-05-21 LAB — COMPREHENSIVE METABOLIC PANEL
ALT: 9 U/L (ref 0–35)
AST: 12 U/L (ref 0–37)
Albumin: 3.9 g/dL (ref 3.5–5.2)
Alkaline Phosphatase: 53 U/L (ref 39–117)
Chloride: 102 mEq/L (ref 96–112)
Potassium: 3.7 mEq/L (ref 3.5–5.1)
Total Bilirubin: 0.5 mg/dL (ref 0.3–1.2)

## 2013-05-21 LAB — CBC WITH DIFFERENTIAL/PLATELET
Basophils Absolute: 0 10*3/uL (ref 0.0–0.1)
Basophils Relative: 0 % (ref 0–1)
Hemoglobin: 13.9 g/dL (ref 12.0–15.0)
Lymphocytes Relative: 19 % (ref 12–46)
MCHC: 35.4 g/dL (ref 30.0–36.0)
Neutro Abs: 5.6 10*3/uL (ref 1.7–7.7)
Neutrophils Relative %: 73 % (ref 43–77)
RDW: 12.7 % (ref 11.5–15.5)
WBC: 7.7 10*3/uL (ref 4.0–10.5)

## 2013-05-21 LAB — URINALYSIS, ROUTINE W REFLEX MICROSCOPIC
Ketones, ur: NEGATIVE mg/dL
Nitrite: NEGATIVE
Urobilinogen, UA: 0.2 mg/dL (ref 0.0–1.0)
pH: 6.5 (ref 5.0–8.0)

## 2013-05-21 LAB — URINE MICROSCOPIC-ADD ON

## 2013-05-21 LAB — POCT PREGNANCY, URINE: Preg Test, Ur: POSITIVE — AB

## 2013-05-21 MED ORDER — SODIUM CHLORIDE 0.9 % IV SOLN
Freq: Once | INTRAVENOUS | Status: AC
  Administered 2013-05-21: 10:00:00 via INTRAVENOUS

## 2013-05-21 MED ORDER — MORPHINE SULFATE 4 MG/ML IJ SOLN
4.0000 mg | Freq: Once | INTRAMUSCULAR | Status: AC
  Administered 2013-05-21: 4 mg via INTRAVENOUS
  Filled 2013-05-21: qty 1

## 2013-05-21 MED ORDER — HYDROCODONE-ACETAMINOPHEN 5-325 MG PO TABS: ORAL_TABLET | ORAL | Status: DC

## 2013-05-21 MED ORDER — ONDANSETRON HCL 4 MG/2ML IJ SOLN
4.0000 mg | Freq: Once | INTRAMUSCULAR | Status: AC
  Administered 2013-05-21: 4 mg via INTRAVENOUS
  Filled 2013-05-21: qty 2

## 2013-05-21 NOTE — ED Provider Notes (Signed)
Medical screening examination/treatment/procedure(s) were performed by non-physician practitioner and as supervising physician I was immediately available for consultation/collaboration.   Glynn Octave, MD 05/21/13 [phone]

## 2013-05-21 NOTE — ED Provider Notes (Signed)
History     CSN: [id number]  Arrival date & time 05/21/13  0844   First MD Initiated Contact with Patient 05/21/13 [phone]      Chief Complaint  Patient presents with  . Abdominal Pain    (Consider location/radiation/quality/duration/timing/severity/associated sxs/prior treatment) HPI Comments: ROSELIA SNIPE is a 27 y.o. female who presents to the Emergency Department complaining of lower abdominal pain and cramping that began 2 days ago. She states that she felt an urge to void and when she D. if she noticed a "gush of blood" from her vagina. She states that since that time she has had vaginal bleeding with small size clots. She reports a history of irregular periods with the last menstrual cycle approximately one year ago which she states is normal for her due to a thyroid disorder.  States she had a D&C secondary to a miscarriage last year and has been taking oral birth control since then.  Has hx of three previous miscarriages. Denies ectopic or abortion  Patient is a 27 y.o. female presenting with abdominal pain. The history is provided by the patient.  Abdominal Pain This is a new problem. Episode onset: 2 days ago. The problem occurs constantly. The problem has been gradually worsening. Associated symptoms include abdominal pain and nausea. Pertinent negatives include no arthralgias, change in bowel habit, chest pain, chills, diaphoresis, fever, headaches, neck pain, numbness, rash, urinary symptoms, vomiting or weakness. Nothing aggravates the symptoms. She has tried acetaminophen and NSAIDs for the symptoms. The treatment provided no relief.    Past Medical History  Diagnosis Date  . Thyroid disease   . Grave's disease     Past Surgical History  Procedure Laterality Date  . Thyroid surgery    . Fracture surgery    . Dilation and curettage of uterus    . Dilation and curettage of uterus  05/10/2012    Procedure: DILATATION AND CURETTAGE;  Surgeon: Tilda Burrow, MD;  Location:  [facility];  Service: Gynecology;  Laterality: N/A;  suction  . Iud removal  05/10/2012    Procedure: INTRAUTERINE DEVICE (IUD) REMOVAL;  Surgeon: Tilda Burrow, MD;  Location: [facility];  Service: Gynecology;;    Family History  Problem Relation Age of Onset  . Anesthesia problems Neg Hx   . Hypotension Neg Hx   . Malignant hyperthermia Neg Hx   . Pseudochol deficiency Neg Hx     History  Substance Use Topics  . Smoking status: Never Smoker   . Smokeless tobacco: Not on file  . Alcohol Use: Yes     Comment: social    OB History   Grav Para Term Preterm Abortions TAB SAB Ect Mult Living                  Review of Systems  Constitutional: Negative for fever, chills, diaphoresis and appetite change.  HENT: Negative for neck pain.   Respiratory: Negative for shortness of breath.   Cardiovascular: Negative for chest pain.  Gastrointestinal: Positive for nausea and abdominal pain. Negative for vomiting, blood in stool and change in bowel habit.  Genitourinary: Positive for vaginal bleeding, vaginal pain and menstrual problem. Negative for dysuria, frequency, flank pain, decreased urine volume, difficulty urinating and genital sores.  Musculoskeletal: Negative for back pain and arthralgias.  Skin: Negative for color change and rash.  Neurological: Negative for dizziness, weakness, numbness and headaches.  Hematological: Negative for adenopathy.  All other systems reviewed and are negative.    Allergies  Aleve and Iodine  Home Medications   Current Outpatient Rx  Name  Route  Sig  Dispense  Refill  . acetaminophen (TYLENOL) 500 MG tablet   Oral   Take 1,000 mg by mouth as needed.         . ethynodiol-ethinyl estradiol (ZOVIA) 1-50 MG-MCG tablet   Oral   Take 1 tablet by mouth daily.         Marland Kitchen ibuprofen (ADVIL,MOTRIN) 200 MG tablet   Oral   Take 400 mg by mouth as needed. FOR PAIN         . levothyroxine (SYNTHROID, LEVOTHROID) 200 MCG tablet   Oral   Take 200  mcg by mouth daily.           BP 128/90  Pulse 86  Temp(Src) 98 F (36.7 C)  Resp 20  SpO2 98%  Physical Exam  Nursing note and vitals reviewed. Constitutional: She is oriented to person, place, and time. She appears well-developed and well-nourished. No distress.  HENT:  Head: Normocephalic and atraumatic.  Mouth/Throat: Oropharynx is clear and moist.  Cardiovascular: Normal rate, regular rhythm, normal heart sounds and intact distal pulses.   No murmur heard. Pulmonary/Chest: Effort normal and breath sounds normal.  Abdominal: Soft. Bowel sounds are normal. She exhibits no distension and no mass. There is no hepatosplenomegaly. There is tenderness in the right lower quadrant and left lower quadrant. There is no rigidity, no rebound, no guarding, no CVA tenderness and no tenderness at McBurney's point.  Diffuse ttp of the lower abdomen. No guarding or rebound tenderness  Genitourinary: Uterus is tender. Cervix exhibits motion tenderness. Cervix exhibits no friability. Right adnexum displays no mass and no tenderness. Left adnexum displays no mass and no tenderness. There is bleeding around the vagina. No vaginal discharge found.  Mild CMT, os is closed.  Dark blood in the vaginal vault.  No clots seen.    Musculoskeletal: She exhibits no edema.  Neurological: She is alert and oriented to person, place, and time.  Skin: Skin is warm and dry.    ED Course  Procedures (including critical care time)  Results for orders placed during the hospital encounter of 05/21/13  WET PREP, GENITAL      Result Value Range   Yeast Wet Prep HPF POC NONE SEEN  NONE SEEN   Trich, Wet Prep NONE SEEN  NONE SEEN   Clue Cells Wet Prep HPF POC MANY (*) NONE SEEN   WBC, Wet Prep HPF POC MANY (*) NONE SEEN  URINALYSIS, ROUTINE W REFLEX MICROSCOPIC      Result Value Range   Color, Urine YELLOW  YELLOW   APPearance CLEAR  CLEAR   Specific Gravity, Urine 1.025  1.005 - 1.030   pH 6.5  5.0 - 8.0    Glucose, UA NEGATIVE  NEGATIVE mg/dL   Hgb urine dipstick LARGE (*) NEGATIVE   Bilirubin Urine NEGATIVE  NEGATIVE   Ketones, ur NEGATIVE  NEGATIVE mg/dL   Protein, ur 30 (*) NEGATIVE mg/dL   Urobilinogen, UA 0.2  0.0 - 1.0 mg/dL   Nitrite NEGATIVE  NEGATIVE   Leukocytes, UA TRACE (*) NEGATIVE  CBC WITH DIFFERENTIAL      Result Value Range   WBC 7.7  4.0 - 10.5 K/uL   RBC 4.78  3.87 - 5.11 MIL/uL   Hemoglobin 13.9  12.0 - 15.0 g/dL   HCT 16.1  09.6 - 04.5 %   MCV 82.2  78.0 - 100.0 fL  MCH 29.1  26.0 - 34.0 pg   MCHC 35.4  30.0 - 36.0 g/dL   RDW 81.1  91.4 - 78.2 %   Platelets 144 (*) 150 - 400 K/uL   Neutrophils Relative % 73  43 - 77 %   Neutro Abs 5.6  1.7 - 7.7 K/uL   Lymphocytes Relative 19  12 - 46 %   Lymphs Abs 1.5  0.7 - 4.0 K/uL   Monocytes Relative 5  3 - 12 %   Monocytes Absolute 0.4  0.1 - 1.0 K/uL   Eosinophils Relative 3  0 - 5 %   Eosinophils Absolute 0.2  0.0 - 0.7 K/uL   Basophils Relative 0  0 - 1 %   Basophils Absolute 0.0  0.0 - 0.1 K/uL  COMPREHENSIVE METABOLIC PANEL      Result Value Range   Sodium 138  135 - 145 mEq/L   Potassium 3.7  3.5 - 5.1 mEq/L   Chloride 102  96 - 112 mEq/L   CO2 25  19 - 32 mEq/L   Glucose, Bld 97  70 - 99 mg/dL   BUN 7  6 - 23 mg/dL   Creatinine, Ser 9.56  0.50 - 1.10 mg/dL   Calcium 9.2  8.4 - 21.3 mg/dL   Total Protein 7.2  6.0 - 8.3 g/dL   Albumin 3.9  3.5 - 5.2 g/dL   AST 12  0 - 37 U/L   ALT 9  0 - 35 U/L   Alkaline Phosphatase 53  39 - 117 U/L   Total Bilirubin 0.5  0.3 - 1.2 mg/dL   GFR calc non Af Amer >90  >90 mL/min   GFR calc Af Amer >90  >90 mL/min  URINE MICROSCOPIC-ADD ON      Result Value Range   Squamous Epithelial / LPF RARE  RARE   WBC, UA 0-2  <3 WBC/hpf   RBC / HPF 7-10  <3 RBC/hpf   Bacteria, UA MANY (*) RARE  HCG, QUANTITATIVE, PREGNANCY      Result Value Range   hCG, Beta Chain, Quant, S 55318 (*) <5 mIU/mL  POCT PREGNANCY, URINE      Result Value Range   Preg Test, Ur POSITIVE (*)  NEGATIVE   US Ob Comp Less 14 Wks  05/21/2013   *RADIOLOGY REPORT*  Clinical Data: Abdominal pain, pregnant, quantitative beta HCG = 55,318  OBSTETRIC <14 WK [place] AND TRANSVAGINAL OB US  Technique:  Both transabdominal and transvaginal ultrasound examinations were performed for complete evaluation of the gestation as well as the maternal uterus, adnexal regions, and pelvic cul-de-sac.  Transvaginal technique was performed to assess early pregnancy.  Comparison:  None  Intrauterine gestational sac:  Visualized/normal in shape. Yolk sac: Present Embryo: Present Cardiac Activity: Present Heart Rate: 110 bpm  CRL: 7.9  mm        6 w  5 d        [place] EDC: 01/09/2014  Maternal uterus/adnexae: No subchorionic hemorrhage. Nodule with cystic center in right ovary 2.3 x 1.8 x 1.6 cm question corpus luteum. Additional small probable hemorrhagic cyst within left ovary 2.8 x 2.3 x 1.9 cm. No free pelvic fluid or additional adnexal masses.  IMPRESSION: Single live early intrauterine gestation measured at 6 weeks 5 days EGA by crown-rump length. Suspect small corpus luteum within right ovary and small hemorrhagic cyst within left ovary.   Original Report Authenticated By: Ulyses Southward, M.D.   US Ob Transvaginal  05/21/2013   *RADIOLOGY REPORT*  Clinical Data: Abdominal pain, pregnant, quantitative beta HCG = 55,318  OBSTETRIC <14 WK [place] AND TRANSVAGINAL OB US  Technique:  Both transabdominal and transvaginal ultrasound examinations were performed for complete evaluation of the gestation as well as the maternal uterus, adnexal regions, and pelvic cul-de-sac.  Transvaginal technique was performed to assess early pregnancy.  Comparison:  None  Intrauterine gestational sac:  Visualized/normal in shape. Yolk sac: Present Embryo: Present Cardiac Activity: Present Heart Rate: 110 bpm  CRL: 7.9  mm        6 w  5 d        [place] EDC: 01/09/2014  Maternal uterus/adnexae: No subchorionic hemorrhage. Nodule with cystic center in right ovary 2.3 x  1.8 x 1.6 cm question corpus luteum. Additional small probable hemorrhagic cyst within left ovary 2.8 x 2.3 x 1.9 cm. No free pelvic fluid or additional adnexal masses.  IMPRESSION: Single live early intrauterine gestation measured at 6 weeks 5 days EGA by crown-rump length. Suspect small corpus luteum within right ovary and small hemorrhagic cyst within left ovary.   Original Report Authenticated By: Ulyses Southward, M.D.          MDM     Patient is noted to be Rh+ from previous chart dated 05/10/2012 by Dr. Emelda Fear.  Patient is resting comfortably, vital signs are stable. Only small amount of blood in the vaginal vault at this time.  Ultrasound and laboratory values were discussed with the patient. The patient has an early live intrauterine pregnancy, but possibility of miscarriage is present given previous hx and vaginal bleeding.  She verbalized understanding of this and agrees to close f/u with OB/GYN this week.  I have also advised her to return here if the sx's worsen.   The patient appears reasonably screened and/or stabilized for discharge and I doubt any other medical condition or other [facility] requiring further screening, evaluation, or treatment in the ED at this time prior to discharge.   Delia Slatten L. Adriann Ballweg, PA-C 05/21/13 1325

## 2013-05-21 NOTE — ED Notes (Signed)
Pt reports started having abd cramping Saturday.  States went to the restroom and had reports had a gush of blood and fluid come out of her vagina.  Pt says has been spotting ever since.  LMP was over a year ago because of thyroid problems.

## 2013-05-22 ENCOUNTER — Encounter: Payer: Self-pay | Admitting: *Deleted

## 2013-05-22 LAB — GC/CHLAMYDIA PROBE AMP
CT Probe RNA: NEGATIVE
GC Probe RNA: NEGATIVE

## 2013-05-23 ENCOUNTER — Other Ambulatory Visit: Payer: Self-pay

## 2013-05-23 ENCOUNTER — Other Ambulatory Visit: Payer: Self-pay | Admitting: Obstetrics & Gynecology

## 2013-05-23 DIAGNOSIS — O3680X1 Pregnancy with inconclusive fetal viability, fetus 1: Secondary | ICD-10-CM

## 2013-05-30 ENCOUNTER — Other Ambulatory Visit: Payer: Self-pay

## 2013-05-30 ENCOUNTER — Telehealth: Payer: Self-pay | Admitting: *Deleted

## 2013-05-30 NOTE — Telephone Encounter (Signed)
Spoke with pt. Has not had appt here yet, but thinks she is 8-[redacted] weeks pregnant. Started bleeding last pm at 9, bright red/brown. Having pain, dizziness, nausea. Went to [facility] today. Had bloodwork only, no [place]. Advised if pt got worse, go to [facility] ER tonight. Appt scheduled for 2pm tomorrow with Dr. Despina Hidden. Pt voiced understanding. JSY

## 2013-05-31 ENCOUNTER — Encounter: Payer: Self-pay | Admitting: Obstetrics & Gynecology

## 2013-05-31 ENCOUNTER — Ambulatory Visit (INDEPENDENT_AMBULATORY_CARE_PROVIDER_SITE_OTHER): Payer: Medicaid Other | Admitting: Obstetrics & Gynecology

## 2013-05-31 VITALS — BP 120/80 | Ht 67.0 in | Wt 203.0 lb

## 2013-05-31 DIAGNOSIS — O200 Threatened abortion: Secondary | ICD-10-CM

## 2013-05-31 NOTE — Patient Instructions (Signed)
Threatened Miscarriage  Bleeding during the first 20 weeks of pregnancy is common. This is sometimes called a threatened miscarriage. This is a pregnancy that is threatening to end before the twentieth week of pregnancy. Often this bleeding stops with bed rest or decreased activities as suggested by your caregiver and the pregnancy continues without any more problems. You may be asked to not have sexual intercourse, have orgasms or use tampons until further notice. Sometimes a threatened miscarriage can progress to a complete or incomplete miscarriage. This may or may not require further treatment. Some miscarriages occur before a woman misses a menstrual period and knows she is pregnant.  Miscarriages occur in 15 to 20% of all pregnancies and usually occur during the first 13 weeks of the pregnancy. The exact cause of a miscarriage is usually never known. A miscarriage is natures way of ending a pregnancy that is abnormal or would not make it to term. There are some things that may put you at risk to have a miscarriage, such as:  · Hormone problems.  · Infection of the uterus or cervix.  · Chronic illness, diabetes for example, especially if it is not controlled.  · Abnormal shaped uterus.  · Fibroids in the uterus.  · Incompetent cervix (the cervix is too weak to hold the baby).  · Smoking.  · Drinking too much alcohol. It's best not to drink any alcohol when you are pregnant.  · Taking illegal drugs.  TREATMENT   When a miscarriage becomes complete and all products of conception (all the tissue in the uterus) have been passed, often no treatment is needed. If you think you passed tissue, save it in a container and take it to your doctor for evaluation. If the miscarriage is incomplete (parts of the fetus or placenta remain in the uterus), further treatment may be needed. The most common reason for further treatment is continued bleeding (hemorrhage) because pregnancy tissue did not pass out of the uterus. This  often occurs if a miscarriage is incomplete. Tissue left behind may also become infected. Treatment usually is dilatation and curettage (the removal of the remaining products of pregnancy. This can be done by a simple sucking procedure (suction curettage) or a simple scraping of the inside of the uterus. This may be done in the hospital or in the caregiver's office. This is only done when your caregiver knows that there is no chance for the pregnancy to proceed to term. This is determined by physical examination, negative pregnancy test, falling pregnancy hormone count and/or, an ultrasound revealing a dead fetus.  Miscarriages are often a very emotional time for prospective mothers and fathers. This is not you or your partners fault. It did not occur because of an inadequacy in you or your partner. Nearly all miscarriages occur because the pregnancy has started off wrongly. At least half of these pregnancies have a chromosomal abnormality. It is almost always not inherited. Others may have developmental problems with the fetus or placenta. This does not always show up even when the products miscarried are studied under the microscope. The miscarriage is nearly always not your fault and it is not likely that you could have prevented it from happening. If you are having emotional and grieving problems, talk to your health care provider and even seek counseling, if necessary, before getting pregnant again. You can begin trying for another pregnancy as soon as your caregiver says it is OK.  HOME CARE INSTRUCTIONS   · Your caregiver may order   bed rest depending on how much bleeding and cramping you are having. You may be limited to only getting up to go to the bathroom. You may be allowed to continue light activity. You may need to make arrangements for the care of your other children and for any other responsibilities.  · Keep track of the number of pads you use each day, how often you have to change pads and how  saturated (soaked) they are. Record this information.  · DO NOT USE TAMPONS. Do not douche, have sexual intercourse or orgasms until approved by your caregiver.  · You may receive a follow up appointment for re-evaluation of your pregnancy and a repeat blood test. Re-evaluation often occurs after 2 days and again in 4 to 6 weeks. It is very important that you follow-up in the recommended time period.  · If you are Rh negative and the father is Rh positive or you do not know the fathers' blood type, you may receive a shot (Rh immune globulin) to help prevent abnormal antibodies that can develop and affect the baby in any future pregnancies.  SEEK IMMEDIATE MEDICAL CARE IF:  · You have severe cramps in your stomach, back, or abdomen.  · You have a sudden onset of severe pain in the lower part of your abdomen.  · You develop chills.  · You run an unexplained temperature of 101° F (38.3° C) or higher.  · You pass large clots or tissue. Save any tissue for your caregiver to inspect.  · Your bleeding increases or you become light-headed, weak, or have fainting episodes.  · You have a gush of fluid from your vagina.  · You pass out. This could mean you have a tubal (ectopic) pregnancy.  Document Released: 11/29/2005 Document Revised: 02/21/2012 Document Reviewed: 07/15/2008  [employer]® Patient Information ©2014 [employer].

## 2013-05-31 NOTE — Progress Notes (Signed)
Patient ID: Carolyn [name], female   DOB: [birth year], 27 y.o.   MRN: [id number] [name] comes in for evaluation of bleeding and pain which has been happening now the last 2 weeks. Unfortunately had not had access to her visits and evaluations in the [facility], I even access her on personal [facility] access for could not find any information available.  In summary she began having some bleeding and pain about 2 weeks ago and today and will regional where she had a sonogram that revealed a viable intrauterine pregnancy approximately [redacted] weeks gestation which was about 2 weeks less than she thought she was placed in her last menstrual.  In the meantime she's had pain and bleeding sometimes quite heavy including using a towel to soak it. She went back today and will regional last night and really didn't have much of a significant evaluation except an hCG level which I do not have access to  Today I performed an ultrasound myself which revealed a viable intrauterine pregnancy with a crown-rump length of 1.47 cm corresponding to 8 weeks and 1 day gestation fetal heart rate of 160. I do not see any significant subchorionic bleeding no pooling of blood in the subchorionic spacer in the cervix there is no free fluid in the posterior cul-de-sac.  Baseline the amount of bleeding the patient is having her pain I am somewhat surprised that this remains a viable pregnancy and that information is related to [facility]. She understands my concern about this eventually becoming a miscarriage but certainly at this point we have a viable pregnancy and she understands that I then surprised in the past on presentation slightest that resulted in normal pregnancies and normal outcomes.  For now I will see [name] back in one week for followup evaluation with sonogram as hCG levels at this point are meaningless.  Her blood type is O+.

## 2013-06-01 ENCOUNTER — Other Ambulatory Visit: Payer: Self-pay

## 2013-06-07 ENCOUNTER — Encounter: Payer: Medicaid Other | Admitting: Obstetrics & Gynecology

## 2013-06-07 ENCOUNTER — Other Ambulatory Visit: Payer: Medicaid Other

## 2013-06-18 ENCOUNTER — Observation Stay (HOSPITAL_COMMUNITY)
Admission: EM | Admit: 2013-06-18 | Discharge: 2013-06-20 | Disposition: A | Discharge status: Home / Self Care | Payer: Medicaid Other | Attending: Obstetrics and Gynecology | Admitting: Obstetrics and Gynecology

## 2013-06-18 ENCOUNTER — Encounter (HOSPITAL_COMMUNITY): Payer: Self-pay

## 2013-06-18 DIAGNOSIS — O200 Threatened abortion: Secondary | ICD-10-CM

## 2013-06-18 DIAGNOSIS — Z01818 Encounter for other preprocedural examination: Secondary | ICD-10-CM

## 2013-06-18 DIAGNOSIS — O030 Genital tract and pelvic infection following incomplete spontaneous abortion: Principal | ICD-10-CM | POA: Insufficient documentation

## 2013-06-18 DIAGNOSIS — O034 Incomplete spontaneous abortion without complication: Secondary | ICD-10-CM

## 2013-06-18 LAB — CBC WITH DIFFERENTIAL/PLATELET
Basophils Absolute: 0 10*3/uL (ref 0.0–0.1)
Eosinophils Relative: 0 % (ref 0–5)
HCT: 36.2 % (ref 36.0–46.0)
Lymphocytes Relative: 5 % — ABNORMAL LOW (ref 12–46)
MCV: 83.2 fL (ref 78.0–100.0)
Monocytes Absolute: 0.7 10*3/uL (ref 0.1–1.0)
RDW: 13.7 % (ref 11.5–15.5)
WBC: 12.3 10*3/uL — ABNORMAL HIGH (ref 4.0–10.5)

## 2013-06-18 LAB — BASIC METABOLIC PANEL
CO2: 22 mEq/L (ref 19–32)
Calcium: 9 mg/dL (ref 8.4–10.5)
Creatinine, Ser: 0.78 mg/dL (ref 0.50–1.10)
Glucose, Bld: 117 mg/dL — ABNORMAL HIGH (ref 70–99)

## 2013-06-18 LAB — WET PREP, GENITAL: Yeast Wet Prep HPF POC: NONE SEEN

## 2013-06-18 MED ORDER — SODIUM CHLORIDE 0.9 % IV BOLUS (SEPSIS)
1000.0000 mL | Freq: Once | INTRAVENOUS | Status: AC
  Administered 2013-06-18: 1000 mL via INTRAVENOUS

## 2013-06-18 MED ORDER — ONDANSETRON HCL 4 MG/2ML IJ SOLN
4.0000 mg | Freq: Once | INTRAMUSCULAR | Status: AC
  Administered 2013-06-18: 4 mg via INTRAVENOUS
  Filled 2013-06-18: qty 2

## 2013-06-18 MED ORDER — HYDROMORPHONE HCL PF 1 MG/ML IJ SOLN
1.0000 mg | Freq: Once | INTRAMUSCULAR | Status: AC
  Administered 2013-06-18: 1 mg via INTRAVENOUS
  Filled 2013-06-18: qty 1

## 2013-06-18 MED ORDER — HYDROMORPHONE HCL PF 1 MG/ML IJ SOLN
1.0000 mg | INTRAMUSCULAR | Status: DC | PRN
Start: 2013-06-18 — End: 2013-06-19
  Administered 2013-06-18 – 2013-06-19 (×3): 1 mg via INTRAVENOUS
  Filled 2013-06-18 (×3): qty 1

## 2013-06-18 MED ORDER — ONDANSETRON HCL 4 MG/2ML IJ SOLN
4.0000 mg | Freq: Four times a day (QID) | INTRAMUSCULAR | Status: DC | PRN
  Administered 2013-06-18: 4 mg via INTRAVENOUS
  Filled 2013-06-18: qty 2

## 2013-06-18 MED ORDER — LIDOCAINE HCL (PF) 1 % IJ SOLN
INTRAMUSCULAR | Status: AC
  Filled 2013-06-18: qty 5

## 2013-06-18 MED ORDER — PRENATAL MULTIVITAMIN CH
1.0000 | ORAL_TABLET | Freq: Every day | ORAL | Status: DC
  Filled 2013-06-18 (×2): qty 1

## 2013-06-18 MED ORDER — SODIUM CHLORIDE 0.9 % IV SOLN
INTRAVENOUS | Status: DC
  Administered 2013-06-19: 06:00:00 via INTRAVENOUS

## 2013-06-18 MED ORDER — CEFTRIAXONE SODIUM 1 G IJ SOLR
1.0000 g | Freq: Once | INTRAMUSCULAR | Status: AC
  Administered 2013-06-18: 1 g via INTRAMUSCULAR
  Filled 2013-06-18: qty 10

## 2013-06-18 MED ORDER — MISOPROSTOL 200 MCG PO TABS
800.0000 ug | ORAL_TABLET | Freq: Once | ORAL | Status: AC
  Administered 2013-06-18: 800 ug via ORAL
  Filled 2013-06-18: qty 4

## 2013-06-18 MED ORDER — OXYCODONE-ACETAMINOPHEN 5-325 MG PO TABS
1.0000 | ORAL_TABLET | ORAL | Status: DC | PRN
  Administered 2013-06-19: 2 via ORAL
  Filled 2013-06-18: qty 2

## 2013-06-18 MED ORDER — ONDANSETRON HCL 4 MG PO TABS: 4.0000 mg | ORAL_TABLET | Freq: Four times a day (QID) | ORAL | Status: DC | PRN

## 2013-06-18 NOTE — H&P (Signed)
Carolyn [name] is an 27 y.o. female. She is admitted for pain management and medical management of imcomplete abortion.  She has been cramping and actively passing clots and small fragments of decidua today , and reports spotting and small clots x 3 days.  Pt has not kept office appts for u/s monitoring of threatened Abortion. Pt here with female partner and pt's mother. Pt has not informed all of pregnancy. US OB Transvaginal Status: Final result     ULTRASOUND FROM 6/9    PACS Images    Show images for US OB Transvaginal     ULTRASOUND    Study Result    *RADIOLOGY REPORT*  Clinical Data: Abdominal pain, pregnant, quantitative beta HCG =  55,318  OBSTETRIC <14 WK [place] AND TRANSVAGINAL OB US  Technique: Both transabdominal and transvaginal ultrasound  examinations were performed for complete evaluation of the  gestation as well as the maternal uterus, adnexal regions, and  pelvic cul-de-sac. Transvaginal technique was performed to assess  early pregnancy.  Comparison: None  Intrauterine gestational sac: Visualized/normal in shape.  Yolk sac: Present  Embryo: Present  Cardiac Activity: Present  Heart Rate: 110 bpm  CRL: 7.9 mm 6 w 5 d [place] EDC: 01/09/2014  Maternal uterus/adnexae:  No subchorionic hemorrhage.  Nodule with cystic center in right ovary 2.3 x 1.8 x 1.6 cm  question corpus luteum.  Additional small probable hemorrhagic cyst within left ovary 2.8 x  2.3 x 1.9 cm.  No free pelvic fluid or additional adnexal masses.  IMPRESSION:  Single live early intrauterine gestation measured at 6 weeks 5 days  EGA by crown-rump length.  Suspect small corpus luteum within right ovary and small  hemorrhagic cyst within left ovary.  Original Report Authenticated By: Ulyses Southward, M.D.   BEDSIDE U/S BY ME TONIGHT SHOWS NO EVIDENCE OF AN INTRAUTERINE GEST SAC, AND APPARENT HETEROGENOUS MATERIAL CONSISTENT WITH TISSUE AND CLOTS. Pertinent Gynecological History: Menses:  Bleeding:  clots small, dark blood and tissue fragments Contraception: none DES exposure: unknown Blood transfusions: none Sexually transmitted diseases: no past history Previous GYN Procedures: DNC  Last mammogram:  Date:  Last pap:  Date:  OB History: G3, P0011, now P0021   Menstrual History: Menarche age:  No LMP recorded. Patient is not currently having periods (Reason: Irregular Periods).    Past Medical History  Diagnosis Date  . Thyroid disease   . Grave's disease   . Pregnancy     Past Surgical History  Procedure Laterality Date  . Thyroid surgery    . Fracture surgery    . Dilation and curettage of uterus    . Dilation and curettage of uterus  05/10/2012    Procedure: DILATATION AND CURETTAGE;  Surgeon: Tilda Burrow, MD;  Location: [facility];  Service: Gynecology;  Laterality: N/A;  suction  . Iud removal  05/10/2012    Procedure: INTRAUTERINE DEVICE (IUD) REMOVAL;  Surgeon: Tilda Burrow, MD;  Location: [facility];  Service: Gynecology;;  . Ankle surgery Left     pin put in    Family History  Problem Relation Age of Onset  . Anesthesia problems Neg Hx   . Hypotension Neg Hx   . Malignant hyperthermia Neg Hx   . Pseudochol deficiency Neg Hx   . Diabetes Mother   . Hypertension Mother   . Hyperlipidemia Mother   . Hypertension Father   . Hyperlipidemia Father   . Cancer Maternal Grandmother     uterine   .  Heart disease Maternal Grandfather     Social History:  reports that she has never smoked. She has never used smokeless tobacco. She reports that  drinks alcohol. She reports that she does not use illicit drugs.  Allergies:  Allergies  Allergen Reactions  . Aleve (Naproxen Sodium) Nausea And Vomiting    'makes headaches worse"  . Iodine Other (See Comments)    Causes blackouts     (Not in a hospital admission)  ROS  Blood pressure 137/83, pulse 119, temperature 100.4 F (38 C), temperature source Oral, resp. rate 28, height 5\' 7"  (1.702 m), weight 200 lb  (90.719 kg), SpO2 96.00%. Physical Exam Physical Examination: General appearance - oriented to person, place, and time, overweight, well hydrated, anxious, in mild to moderate distress, crying and uncomfortable Mental status - alert, oriented to person, place, and time Chest - clear to auscultation, no wheezes, rales or rhonchi, symmetric air entry Heart - normal rate and regular rhythm Abdomen - soft, nontender, nondistended, no masses or organomegaly Pelvic - VULVA: normal appearing vulva with no masses, tenderness or lesions, VAGINA: normal appearing vagina with normal color and discharge, no lesions, moderate blood 10cc and clots, CERVIX: normal appearing cervix without discharge or lesions, patulous, allows passage of ring forceps , UTERUS: uterus is normal size, shape, consistency and nontender, anteverted, enlarged to 6 week's size, ADNEXA: normal adnexa in size, nontender and no masses   Results for orders placed during the hospital encounter of 06/18/13 (from the past 24 hour(s))  CBC WITH DIFFERENTIAL     Status: Abnormal   Collection Time    06/18/13  6:42 PM      Result Value Range   WBC 12.3 (*) 4.0 - 10.5 K/uL   RBC 4.35  3.87 - 5.11 MIL/uL   Hemoglobin 12.9  12.0 - 15.0 g/dL   HCT 46.9  62.9 - 52.8 %   MCV 83.2  78.0 - 100.0 fL   MCH 29.7  26.0 - 34.0 pg   MCHC 35.6  30.0 - 36.0 g/dL   RDW 41.3  24.4 - 01.0 %   Platelets 118 (*) 150 - 400 K/uL   Neutrophils Relative % 90 (*) 43 - 77 %   Neutro Abs 11.0 (*) 1.7 - 7.7 K/uL   Lymphocytes Relative 5 (*) 12 - 46 %   Lymphs Abs 0.6 (*) 0.7 - 4.0 K/uL   Monocytes Relative 5  3 - 12 %   Monocytes Absolute 0.7  0.1 - 1.0 K/uL   Eosinophils Relative 0  0 - 5 %   Eosinophils Absolute 0.0  0.0 - 0.7 K/uL   Basophils Relative 0  0 - 1 %   Basophils Absolute 0.0  0.0 - 0.1 K/uL  BASIC METABOLIC PANEL     Status: Abnormal   Collection Time    06/18/13  6:42 PM      Result Value Range   Sodium 131 (*) 135 - 145 mEq/L   Potassium  3.2 (*) 3.5 - 5.1 mEq/L   Chloride 95 (*) 96 - 112 mEq/L   CO2 22  19 - 32 mEq/L   Glucose, Bld 117 (*) 70 - 99 mg/dL   BUN 6  6 - 23 mg/dL   Creatinine, Ser 2.72  0.50 - 1.10 mg/dL   Calcium 9.0  8.4 - 53.6 mg/dL   GFR calc non Af Amer >90  >90 mL/min   GFR calc Af Amer >90  >90 mL/min  WET PREP, GENITAL  Status: Abnormal   Collection Time    06/18/13  7:41 PM      Result Value Range   Yeast Wet Prep HPF POC NONE SEEN  NONE SEEN   Trich, Wet Prep NONE SEEN  NONE SEEN   Clue Cells Wet Prep HPF POC FEW (*) NONE SEEN   WBC, Wet Prep HPF POC FEW (*) NONE SEEN  O positive  No results found.  Assessment/Plan: INcomplete abortion Low pain tolerance, not a candidate for outpt cytotec  Plan overnight OBS, expect spont passage of tissue           May require D&C if not completed by AM.          Due to several day process, will give dose Rocephin iv    Varnell Orvis V 06/18/2013, 9:06 PM

## 2013-06-18 NOTE — ED Notes (Signed)
Assisted Dr Emelda Fear with pelvic, pt cramping, passing a lot of clots. MD discussed admission with pt. MD okay with pt having ice chips and clear liquids.

## 2013-06-18 NOTE — ED Notes (Signed)
Assisted Dr Emelda Fear with U/S. Pt is cramping, passing clots, MD examined pad. MD wants to do pelvic

## 2013-06-18 NOTE — ED Provider Notes (Signed)
History    CSN: [id number] Arrival date & time 06/18/13  1717  First MD Initiated Contact with Patient 06/18/13 1819     Chief Complaint  Patient presents with  . Vaginal Bleeding  . Nausea   (Consider location/radiation/quality/duration/timing/severity/associated sxs/prior Treatment) HPI Comments: Carolyn [name] is a 27 y.o. Female who complains of worsening vaginal living for 4 days. This was followed by abdominal pain, 2 days ago; that has persisted as "cramping like labor". She is being followed by her on estrogen for "threatened miscarriage". She was last seen 2 weeks ago. She was diagnosed with intrauterine pregnancy, and vaginal bleeding, on 05/21/13. Subsequent to that, she had 2 ultrasounds scheduled, but did not have them done. Her pain is in the lower abdomen, it radiates to her lower back. She reports having chills, nausea, and anorexia for several days. She denies headache, chest pain, shortness of breath, or paresthesias. There are no other known modifying factors.  Patient is a 27 y.o. female presenting with vaginal bleeding. The history is provided by the patient and the spouse.  Vaginal Bleeding  Past Medical History  Diagnosis Date  . Thyroid disease   . Grave's disease   . Pregnancy    Past Surgical History  Procedure Laterality Date  . Thyroid surgery    . Fracture surgery    . Dilation and curettage of uterus    . Dilation and curettage of uterus  05/10/2012    Procedure: DILATATION AND CURETTAGE;  Surgeon: Tilda Burrow, MD;  Location: [facility];  Service: Gynecology;  Laterality: N/A;  suction  . Iud removal  05/10/2012    Procedure: INTRAUTERINE DEVICE (IUD) REMOVAL;  Surgeon: Tilda Burrow, MD;  Location: [facility];  Service: Gynecology;;  . Ankle surgery Left     pin put in   Family History  Problem Relation Age of Onset  . Anesthesia problems Neg Hx   . Hypotension Neg Hx   . Malignant hyperthermia Neg Hx   . Pseudochol deficiency Neg Hx   . Diabetes  Mother   . Hypertension Mother   . Hyperlipidemia Mother   . Hypertension Father   . Hyperlipidemia Father   . Cancer Maternal Grandmother     uterine   . Heart disease Maternal Grandfather    History  Substance Use Topics  . Smoking status: Never Smoker   . Smokeless tobacco: Never Used  . Alcohol Use: Yes     Comment: social   OB History   Grav Para Term Preterm Abortions TAB SAB Ect Mult Living   2 1   1  1   1      Review of Systems  Genitourinary: Positive for vaginal bleeding.  All other systems reviewed and are negative.    Allergies  Aleve and Iodine  Home Medications   Current Outpatient Rx  Name  Route  Sig  Dispense  Refill  . acetaminophen (TYLENOL) 500 MG tablet   Oral   Take 1,000 mg by mouth as needed.         . ethynodiol-ethinyl estradiol (ZOVIA) 1-50 MG-MCG tablet   Oral   Take 1 tablet by mouth daily.         Marland Kitchen HYDROcodone-acetaminophen (NORCO/VICODIN) 5-325 MG per tablet      Take one tab po q 4-6 hrs prn pain   8 tablet   0   . ibuprofen (ADVIL,MOTRIN) 200 MG tablet   Oral   Take 400 mg by mouth as needed.  FOR PAIN         . levothyroxine (SYNTHROID, LEVOTHROID) 200 MCG tablet   Oral   Take 200 mcg by mouth daily.          BP 137/83  Pulse 119  Temp(Src) 100.4 F (38 C) (Oral)  Resp 28  Ht 5\' 7"  (1.702 m)  Wt 200 lb (90.719 kg)  BMI 31.32 kg/m2  SpO2 96% Physical Exam  Nursing note and vitals reviewed. Constitutional: She is oriented to person, place, and time. She appears well-developed and well-nourished. She appears distressed (Uncomfortable).  HENT:  Head: Normocephalic and atraumatic.  Eyes: Conjunctivae and EOM are normal. Pupils are equal, round, and reactive to light.  Neck: Normal range of motion and phonation normal. Neck supple.  Cardiovascular: Normal rate and intact distal pulses.   Tachycardic  Pulmonary/Chest: Effort normal and breath sounds normal. She exhibits no tenderness.  She is tachypneic   Abdominal: Soft. She exhibits no distension. There is tenderness (Moderate right and left lower quadrant, and suprapubic tenderness. Unable to palpate the uterus.). There is no guarding.  Genitourinary:  Normal external female genitalia. Blood in vagina, without clots. He quit discharge per the cervical os. ECC Chlamydia probes were taken from the cervical os. Wet prep was taken from the vaginal secretions. On bimanual examination, the external os is open to fingertip. I am unable to palpate the internal os. The cervix is posterior, and high. On bimanual examination the uterus is retroflexed, appropriate size for gestational age of [redacted] weeks, and is nontender to palpation. There is no palpable adnexal mass.  Musculoskeletal: Normal range of motion.  Neurological: She is alert and oriented to person, place, and time. She has normal strength. She exhibits normal muscle tone.  Skin: Skin is warm and dry.  Psychiatric: She has a normal mood and affect. Her behavior is normal. Judgment and thought content normal.    ED Course  Procedures (including critical care time)  Medications  0.9 %  sodium chloride infusion (not administered)  cefTRIAXone (ROCEPHIN) injection 1 g (not administered)  sodium chloride 0.9 % bolus 1,000 mL (1,000 mLs Intravenous New Bag/Given 06/18/13 1843)  HYDROmorphone (DILAUDID) injection 1 mg (1 mg Intravenous Given 06/18/13 1843)  ondansetron (ZOFRAN) injection 4 mg (4 mg Intravenous Given 06/18/13 1843)  HYDROmorphone (DILAUDID) injection 1 mg (1 mg Intravenous Given 06/18/13 1914)  ondansetron (ZOFRAN) injection 4 mg (4 mg Intravenous Given 06/18/13 2032)  HYDROmorphone (DILAUDID) injection 1 mg (1 mg Intravenous Given 06/18/13 2035)    Patient Vitals for the past 24 hrs:  BP Temp Temp src Pulse Resp SpO2 Height Weight  06/18/13 2100 137/83 mmHg - - 119 28 96 % - -  06/18/13 1936 123/81 mmHg - - 121 36 96 % - -  06/18/13 1721 133/83 mmHg 100.4 F (38 C) Oral 135 18 95 % 5\' 7"   (1.702 m) 200 lb (90.719 kg)    7:44 PM Reevaluation with update and discussion. After initial assessment and treatment, an updated evaluation reveals pt is more comfortable now, HR improved. Pelvic completed. [name]   7:42 PM-Consult complete with Dr. Emelda Fear. Patient case explained and discussed. He agrees to see patient in the ED for further evaluation and treatment. Call ended at 1945     CRITICAL CARE Performed by: Flint Melter Total critical care time: 35 minutes Management of complication of pregnancy, with tachycardia and tachypnea, and vaginal bleeding. Critical care time was exclusive of separately billable procedures and treating other patients. Critical  care was necessary to treat or prevent imminent or life-threatening deterioration. Critical care was time spent personally by me on the following activities: development of treatment plan with patient and/or surrogate as well as nursing, discussions with consultants, evaluation of patient's response to treatment, examination of patient, obtaining history from patient or surrogate, ordering and performing treatments and interventions, ordering and review of laboratory studies, ordering and review of radiographic studies, pulse oximetry and re-evaluation of patient's condition.    Labs Reviewed  WET PREP, GENITAL - Abnormal; Notable for the following:    Clue Cells Wet Prep HPF POC FEW (*)    WBC, Wet Prep HPF POC FEW (*)    All other components within normal limits  CBC WITH DIFFERENTIAL - Abnormal; Notable for the following:    WBC 12.3 (*)    Platelets 118 (*)    Neutrophils Relative % 90 (*)    Neutro Abs 11.0 (*)    Lymphocytes Relative 5 (*)    Lymphs Abs 0.6 (*)    All other components within normal limits  BASIC METABOLIC PANEL - Abnormal; Notable for the following:    Sodium 131 (*)    Potassium 3.2 (*)    Chloride 95 (*)    Glucose, Bld 117 (*)    All other components within normal limits  URINE  CULTURE  GC/CHLAMYDIA PROBE AMP  URINALYSIS, ROUTINE W REFLEX MICROSCOPIC  PREGNANCY, URINE  RPR    1. Threatened abortion in first trimester     MDM  Suspected intrauterine pregnancy with threatened abortion. She is Rh+, on prior testing.   Plan: Evaluation by a gynecologist, in the emergency department  Flint Melter, MD 06/18/13 2154

## 2013-06-18 NOTE — ED Notes (Signed)
Note at 2235 was in the wrong chart.

## 2013-06-18 NOTE — ED Notes (Signed)
Pt stated that she began having heavy vaginal bleeding last night, bright red/gray in color. +nausea since this am, thinks she is [redacted] weeks pregnant.

## 2013-06-18 NOTE — ED Notes (Signed)
Pt stated she thinks she is having a miscarriage, is dizzy and lightheaded. Having some cramping.

## 2013-06-18 NOTE — ED Notes (Signed)
MD and tech at the bedside to do pelvic

## 2013-06-19 ENCOUNTER — Encounter (HOSPITAL_COMMUNITY): Admission: EM | Disposition: A | Discharge status: Home / Self Care | Payer: Self-pay | Source: Home / Self Care

## 2013-06-19 ENCOUNTER — Encounter (HOSPITAL_COMMUNITY): Payer: Self-pay | Admitting: Anesthesiology

## 2013-06-19 ENCOUNTER — Encounter (HOSPITAL_COMMUNITY): Payer: Self-pay | Admitting: *Deleted

## 2013-06-19 ENCOUNTER — Other Ambulatory Visit: Payer: Self-pay | Admitting: Obstetrics and Gynecology

## 2013-06-19 ENCOUNTER — Observation Stay (HOSPITAL_COMMUNITY): Payer: Medicaid Other | Admitting: Anesthesiology

## 2013-06-19 DIAGNOSIS — O030 Genital tract and pelvic infection following incomplete spontaneous abortion: Secondary | ICD-10-CM

## 2013-06-19 HISTORY — PX: DILATION AND CURETTAGE OF UTERUS: SHX78

## 2013-06-19 LAB — URINALYSIS, ROUTINE W REFLEX MICROSCOPIC
Bilirubin Urine: NEGATIVE
Glucose, UA: NEGATIVE mg/dL
Ketones, ur: NEGATIVE mg/dL
Leukocytes, UA: NEGATIVE
Specific Gravity, Urine: >1.03 — ABNORMAL HIGH (ref 1.005–1.030)
pH: 5.5 (ref 5.0–8.0)

## 2013-06-19 LAB — SURGICAL PCR SCREEN: Staphylococcus aureus: NEGATIVE

## 2013-06-19 LAB — URINE MICROSCOPIC-ADD ON

## 2013-06-19 SURGERY — DILATION AND CURETTAGE
Anesthesia: General | Wound class: Dirty or Infected

## 2013-06-19 MED ORDER — FENTANYL CITRATE 0.05 MG/ML IJ SOLN
INTRAMUSCULAR | Status: AC
  Filled 2013-06-19: qty 2

## 2013-06-19 MED ORDER — ONDANSETRON HCL 4 MG/2ML IJ SOLN: 4.0000 mg | Freq: Four times a day (QID) | INTRAMUSCULAR | Status: DC | PRN

## 2013-06-19 MED ORDER — LEVOTHYROXINE SODIUM 100 MCG PO TABS
200.0000 ug | ORAL_TABLET | Freq: Every day | ORAL | Status: DC
  Administered 2013-06-20: 200 ug via ORAL
  Filled 2013-06-19: qty 2

## 2013-06-19 MED ORDER — LACTATED RINGERS IV SOLN
INTRAVENOUS | Status: DC
  Administered 2013-06-19: 10:00:00 via INTRAVENOUS

## 2013-06-19 MED ORDER — ONDANSETRON HCL 4 MG/2ML IJ SOLN
INTRAMUSCULAR | Status: AC
  Filled 2013-06-19: qty 2

## 2013-06-19 MED ORDER — OXYTOCIN 40 UNITS IN LACTATED RINGERS INFUSION - SIMPLE MED
INTRAVENOUS | Status: DC | PRN
  Administered 2013-06-19: 20 [IU] via INTRAVENOUS

## 2013-06-19 MED ORDER — 0.9 % SODIUM CHLORIDE (POUR BTL) OPTIME
TOPICAL | Status: DC | PRN
  Administered 2013-06-19: 1000 mL

## 2013-06-19 MED ORDER — KCL IN DEXTROSE-NACL 20-5-0.45 MEQ/L-%-% IV SOLN
INTRAVENOUS | Status: DC
  Administered 2013-06-19: 13:00:00 via INTRAVENOUS

## 2013-06-19 MED ORDER — LIDOCAINE HCL (CARDIAC) 20 MG/ML IV SOLN
INTRAVENOUS | Status: DC | PRN
  Administered 2013-06-19: 30 mg via INTRAVENOUS

## 2013-06-19 MED ORDER — ONDANSETRON HCL 4 MG PO TABS: 4.0000 mg | ORAL_TABLET | Freq: Four times a day (QID) | ORAL | Status: DC | PRN

## 2013-06-19 MED ORDER — FENTANYL CITRATE 0.05 MG/ML IJ SOLN
INTRAMUSCULAR | Status: DC | PRN
  Administered 2013-06-19 (×2): 25 ug via INTRAVENOUS
  Administered 2013-06-19: 50 ug via INTRAVENOUS

## 2013-06-19 MED ORDER — KETOROLAC TROMETHAMINE 30 MG/ML IJ SOLN
30.0000 mg | Freq: Four times a day (QID) | INTRAMUSCULAR | Status: DC
  Administered 2013-06-19 – 2013-06-20 (×3): 30 mg via INTRAVENOUS
  Filled 2013-06-19 (×3): qty 1

## 2013-06-19 MED ORDER — CEFAZOLIN SODIUM-DEXTROSE 2-3 GM-% IV SOLR
2.0000 g | INTRAVENOUS | Status: AC
  Administered 2013-06-19: 2 g via INTRAVENOUS
  Filled 2013-06-19: qty 50

## 2013-06-19 MED ORDER — FENTANYL CITRATE 0.05 MG/ML IJ SOLN
25.0000 ug | INTRAMUSCULAR | Status: DC | PRN
  Administered 2013-06-19: 25 ug via INTRAVENOUS

## 2013-06-19 MED ORDER — ONDANSETRON HCL 4 MG/2ML IJ SOLN
4.0000 mg | Freq: Once | INTRAMUSCULAR | Status: AC
  Administered 2013-06-19: 4 mg via INTRAVENOUS

## 2013-06-19 MED ORDER — ACETAMINOPHEN 325 MG PO TABS: 650.0000 mg | ORAL_TABLET | ORAL | Status: DC | PRN

## 2013-06-19 MED ORDER — PANTOPRAZOLE SODIUM 40 MG PO TBEC
40.0000 mg | DELAYED_RELEASE_TABLET | Freq: Every day | ORAL | Status: DC
  Administered 2013-06-19 – 2013-06-20 (×2): 40 mg via ORAL
  Filled 2013-06-19 (×2): qty 1

## 2013-06-19 MED ORDER — PROPOFOL 10 MG/ML IV BOLUS
INTRAVENOUS | Status: DC | PRN
  Administered 2013-06-19: 150 mg via INTRAVENOUS

## 2013-06-19 MED ORDER — CEFAZOLIN SODIUM-DEXTROSE 2-3 GM-% IV SOLR
2.0000 g | Freq: Three times a day (TID) | INTRAVENOUS | Status: DC
  Administered 2013-06-19 – 2013-06-20 (×3): 2 g via INTRAVENOUS
  Filled 2013-06-19 (×4): qty 50

## 2013-06-19 MED ORDER — HYDROMORPHONE HCL PF 1 MG/ML IJ SOLN
1.0000 mg | INTRAMUSCULAR | Status: DC | PRN
  Administered 2013-06-19: 1 mg via INTRAVENOUS
  Filled 2013-06-19: qty 1

## 2013-06-19 MED ORDER — OXYCODONE-ACETAMINOPHEN 5-325 MG PO TABS
1.0000 | ORAL_TABLET | ORAL | Status: DC | PRN
  Administered 2013-06-19 – 2013-06-20 (×3): 2 via ORAL
  Filled 2013-06-19 (×3): qty 2

## 2013-06-19 MED ORDER — METRONIDAZOLE IN NACL 5-0.79 MG/ML-% IV SOLN
500.0000 mg | Freq: Three times a day (TID) | INTRAVENOUS | Status: DC
  Administered 2013-06-19 – 2013-06-20 (×3): 500 mg via INTRAVENOUS
  Filled 2013-06-19 (×4): qty 100

## 2013-06-19 MED ORDER — HYDROMORPHONE HCL PF 1 MG/ML IJ SOLN
0.2000 mg | INTRAMUSCULAR | Status: DC | PRN
  Administered 2013-06-19: 0.5 mg via INTRAVENOUS
  Administered 2013-06-19 – 2013-06-20 (×4): 0.6 mg via INTRAVENOUS
  Filled 2013-06-19 (×5): qty 1

## 2013-06-19 MED ORDER — MIDAZOLAM HCL 2 MG/2ML IJ SOLN
INTRAMUSCULAR | Status: AC
  Filled 2013-06-19: qty 2

## 2013-06-19 MED ORDER — KETOROLAC TROMETHAMINE 30 MG/ML IJ SOLN
30.0000 mg | Freq: Once | INTRAMUSCULAR | Status: AC
  Administered 2013-06-19: 30 mg via INTRAVENOUS
  Filled 2013-06-19: qty 1

## 2013-06-19 MED ORDER — FENTANYL CITRATE 0.05 MG/ML IJ SOLN
25.0000 ug | INTRAMUSCULAR | Status: DC | PRN
  Administered 2013-06-19 (×4): 50 ug via INTRAVENOUS

## 2013-06-19 MED ORDER — CEFAZOLIN SODIUM-DEXTROSE 2-3 GM-% IV SOLR
INTRAVENOUS | Status: AC
  Filled 2013-06-19: qty 50

## 2013-06-19 MED ORDER — ONDANSETRON HCL 4 MG/2ML IJ SOLN: 4.0000 mg | Freq: Once | INTRAMUSCULAR | Status: DC | PRN

## 2013-06-19 MED ORDER — MIDAZOLAM HCL 2 MG/2ML IJ SOLN
1.0000 mg | INTRAMUSCULAR | Status: DC | PRN
  Administered 2013-06-19: 2 mg via INTRAVENOUS

## 2013-06-19 SURGICAL SUPPLY — 23 items
BAG HAMPER (MISCELLANEOUS) ×2 IMPLANT
CLOTH BEACON ORANGE TIMEOUT ST (SAFETY) ×2 IMPLANT
COVER LIGHT HANDLE STERIS (MISCELLANEOUS) ×4 IMPLANT
CURETTE VACUUM 9MM CVD CLR (CANNULA) ×2 IMPLANT
FORMALIN 10 PREFIL 480ML (MISCELLANEOUS) ×2 IMPLANT
GAUZE SPONGE 4X4 16PLY XRAY LF (GAUZE/BANDAGES/DRESSINGS) ×2 IMPLANT
GLOVE BIOGEL PI IND STRL 7.0 (GLOVE) ×1 IMPLANT
GLOVE BIOGEL PI INDICATOR 7.0 (GLOVE) ×1
GLOVE ECLIPSE 9.0 STRL (GLOVE) ×2 IMPLANT
GLOVE INDICATOR STER SZ 9 (GLOVE) ×2 IMPLANT
GLOVE SS BIOGEL STRL SZ 6.5 (GLOVE) ×1 IMPLANT
GLOVE SUPERSENSE BIOGEL SZ 6.5 (GLOVE) ×1
GOWN STRL REIN 3XL LVL4 (GOWN DISPOSABLE) ×2 IMPLANT
GOWN STRL REIN XL XLG (GOWN DISPOSABLE) ×2 IMPLANT
KIT BERKELEY 1ST TRIMESTER 3/8 (MISCELLANEOUS) ×2 IMPLANT
KIT ROOM TURNOVER AP CYSTO (KITS) ×2 IMPLANT
MARKER SKIN DUAL TIP RULER LAB (MISCELLANEOUS) ×2 IMPLANT
NS IRRIG 1000ML POUR BTL (IV SOLUTION) ×2 IMPLANT
PACK BASIC III (CUSTOM PROCEDURE TRAY) ×1
PACK SRG BSC III STRL LF ECLPS (CUSTOM PROCEDURE TRAY) ×1 IMPLANT
PAD ARMBOARD 7.5X6 YLW CONV (MISCELLANEOUS) ×2 IMPLANT
SET BERKELEY SUCTION TUBING (SUCTIONS) ×2 IMPLANT
TOWEL OR 17X26 4PK STRL BLUE (TOWEL DISPOSABLE) ×2 IMPLANT

## 2013-06-19 NOTE — Anesthesia Procedure Notes (Signed)
Procedure Name: LMA Insertion Date/Time: 06/19/2013 10:20 AM Performed by: Glynn Octave E Pre-anesthesia Checklist: Patient identified, Patient being monitored, Emergency Drugs available, Timeout performed and Suction available Patient Re-evaluated:Patient Re-evaluated prior to inductionOxygen Delivery Method: Circle System Utilized Preoxygenation: Pre-oxygenation with 100% oxygen Intubation Type: IV induction Ventilation: Mask ventilation without difficulty LMA: LMA inserted LMA Size: 3.0 Number of attempts: 1 Placement Confirmation: positive ETCO2 and breath sounds checked- equal and bilateral

## 2013-06-19 NOTE — Transfer of Care (Signed)
Immediate Anesthesia Transfer of Care Note  Patient: Carolyn [name]  Procedure(s) Performed: Procedure(s): DILATATION AND SUCTION CURETTAGE (N/A)  Patient Location: PACU  Anesthesia Type:General  Level of Consciousness: awake, alert  and oriented  Airway & Oxygen Therapy: Patient Spontanous Breathing  Post-op Assessment: Report given to PACU RN  Post vital signs: Reviewed and stable  Complications: No apparent anesthesia complications

## 2013-06-19 NOTE — Anesthesia Postprocedure Evaluation (Signed)
  Anesthesia Post-op Note  Patient: Carolyn [name]  Procedure(s) Performed: Procedure(s): DILATATION AND SUCTION CURETTAGE (N/A)  Patient Location: PACU  Anesthesia Type:General  Level of Consciousness: awake, alert  and oriented  Airway and Oxygen Therapy: Patient Spontanous Breathing and Patient connected to face mask oxygen  Post-op Pain: none  Post-op Assessment: Post-op Vital signs reviewed, Patient's Cardiovascular Status Stable, Respiratory Function Stable, Patent Airway and No signs of Nausea or vomiting  Post-op Vital Signs: Reviewed and stable  Complications: No apparent anesthesia complications

## 2013-06-19 NOTE — Op Note (Addendum)
  PATIENT:  Carolyn [name]  27 y.o. female  PRE-OPERATIVE DIAGNOSIS:  Incomplete Abortion  POST-OPERATIVE DIAGNOSIS:  Septic Incomplete Abortion  PROCEDURE:  Procedure(s): DILATATION AND SUCTION CURETTAGE (N/A)  SURGEON:  Surgeon(s) and Role:    * Tilda Burrow, MD - Primary Details of procedure: Patient was taken operating room prepped and draped for vaginal procedure a voiding iodine containing solution. Timeout was conducted, Ancef 2 g administered preprocedure. Uterus was sounded to 13 cm dilated to 27 [place] allowing introduction of a 9 mm suction curette which obtained small amounts of tissue but had the sensation of incomplete tissue removal. Ring forceps were easily passed and grasped in large firm hard tissue from the uterine fundus and extracted several large fragments of products of conception and blood clots from the uterine fundus that were hard and malodorous. Please suction then obtained a bit more tissue, and then ring forceps were passed again almost no tissue removed. The uterine contours felt normal. Smooth sharp curettage with a large curet was then performed and residual tissue fragments extracted. Suction curettage once again confirmed satisfactory uterine evacuation. The patient went to recovery in stable condition. Due to the malodor noted during the extraction of the tissue fragments from the uterine fundus we would keep her overnight for antibiotic administration. sponge and needle counts correct. Blood type O positive

## 2013-06-19 NOTE — Progress Notes (Signed)
Subjective: Patient reports continued cramping.  Passed clots thru the night but inspection shows no distinct tissue passage. .  Will need D& C this a.m. Risks of procedure , intended benefits reviewed. Pt agrees to procedure , consents obtained.   Objective: I have reviewed patient's vital signs and medications. BP 94/67  Pulse 95  Temp(Src) 98.5 F (36.9 C) (Oral)  Resp 20  Ht 5\' 7"  (1.702 m)  Wt 203 lb 0.7 oz (92.1 kg)  BMI 31.79 kg/m2  SpO2 98%  Had temp to 103 last evening.  General: alert, cooperative and no distress GI: soft, non-tender; bowel sounds normal; no masses,  no organomegaly Vaginal Bleeding: moderate   Assessment/Plan: INcomplete abortion Plan: suction D&C this a.m.   LOS: 1 day    Carolyn [name] V 06/19/2013, 8:08 AM

## 2013-06-19 NOTE — Anesthesia Preprocedure Evaluation (Signed)
Anesthesia Evaluation  Patient identified by MRN, date of birth, ID band Patient awake    Reviewed: Allergy & Precautions, H&P , NPO status , Patient's Chart, lab work & pertinent test results  History of Anesthesia Complications Negative for: history of anesthetic complications  Airway Mallampati: I TM Distance: >3 FB Neck ROM: Full    Dental  (+) Teeth Intact   Pulmonary neg pulmonary ROS,  breath sounds clear to auscultation        Cardiovascular negative cardio ROS  Rhythm:Regular Rate:Normal     Neuro/Psych    GI/Hepatic PUD,   Endo/Other  Hyperthyroidism   Renal/GU      Musculoskeletal   Abdominal   Peds  Hematology   Anesthesia Other Findings   Reproductive/Obstetrics                           Anesthesia Physical Anesthesia Plan  ASA: II  Anesthesia Plan: General   Post-op Pain Management:    Induction: Intravenous  Airway Management Planned: LMA  Additional Equipment:   Intra-op Plan:   Post-operative Plan: Extubation in OR  Informed Consent: I have reviewed the patients History and Physical, chart, labs and discussed the procedure including the risks, benefits and alternatives for the proposed anesthesia with the patient or authorized representative who has indicated his/her understanding and acceptance.     Plan Discussed with:   Anesthesia Plan Comments:         Anesthesia Quick Evaluation

## 2013-06-19 NOTE — Brief Op Note (Signed)
06/18/2013 - 06/19/2013  11:35 AM  PATIENT:  Charlott Rakes  27 y.o. female  PRE-OPERATIVE DIAGNOSIS:  Incomplete Abortion  POST-OPERATIVE DIAGNOSIS:  Septic Incomplete Abortion  PROCEDURE:  Procedure(s): DILATATION AND SUCTION CURETTAGE (N/A)  SURGEON:  Surgeon(s) and Role:    * Tilda Burrow, MD - Primary  PHYSICIAN ASSISTANT:   ASSISTANTS: none   ANESTHESIA:   general  EBL:  Total I/O In: 430 [P.O.:30; I.V.:400] Out: 100 [Blood:100]  BLOOD ADMINISTERED:none  DRAINS: none   LOCAL MEDICATIONS USED:  NONE  SPECIMEN:  Source of Specimen:  Products of conception  DISPOSITION OF SPECIMEN:  PATHOLOGY  COUNTS:  YES  TOURNIQUET:  * No tourniquets in log *  DICTATION: .Dragon Dictation  PLAN OF CARE: Admit for overnight observation  PATIENT DISPOSITION:  PACU - hemodynamically stable.   Delay start of Pharmacological VTE agent (>24hrs) due to surgical blood loss or risk of bleeding: not applicable

## 2013-06-19 NOTE — Progress Notes (Signed)
Day of Surgery Procedure(s) (LRB): DILATATION AND SUCTION CURETTAGE (N/A)  Subjective: Patient reports incisional pain.    Objective: I have reviewed patient's vital signs.  GI: lower abd cramping  Assessment: s/p Procedure(s): DILATATION AND SUCTION CURETTAGE (N/A): stable  Plan: add toradol q 6h d/c in am.  LOS: 1 day    Kyshon Tolliver V 06/19/2013, 7:52 PM

## 2013-06-19 NOTE — Progress Notes (Signed)
Utilization Review Complete  

## 2013-06-20 LAB — CBC
MCH: 30.5 pg (ref 26.0–34.0)
MCHC: 36.2 g/dL — ABNORMAL HIGH (ref 30.0–36.0)
MCV: 84.3 fL (ref 78.0–100.0)
Platelets: 86 10*3/uL — ABNORMAL LOW (ref 150–400)
RDW: 13.9 % (ref 11.5–15.5)

## 2013-06-20 LAB — URINE CULTURE: Culture: NO GROWTH

## 2013-06-20 MED ORDER — IBUPROFEN 600 MG PO TABS: 600.0000 mg | ORAL_TABLET | Freq: Four times a day (QID) | ORAL | Status: DC | PRN

## 2013-06-20 MED ORDER — HYDROCODONE-ACETAMINOPHEN 5-325 MG PO TABS: 1.0000 | ORAL_TABLET | Freq: Four times a day (QID) | ORAL | Status: DC | PRN

## 2013-06-20 MED ORDER — METRONIDAZOLE 500 MG PO TABS: 500.0000 mg | ORAL_TABLET | Freq: Two times a day (BID) | ORAL | Status: AC

## 2013-06-20 MED ORDER — DOXYCYCLINE HYCLATE 100 MG PO CAPS: 100.0000 mg | ORAL_CAPSULE | Freq: Two times a day (BID) | ORAL | Status: DC

## 2013-06-20 NOTE — Discharge Summary (Signed)
Gynecology Physician Discharge Summary  Patient ID: Carolyn [name] MRN: [id number] DOB/AGE: [birth year] 27 y.o.  Admit date: 06/18/2013 Discharge date: 06/20/2013  Preoperative Diagnoses: Incomplete abortion,   Procedures: Procedure(s) (LRB): DILATATION AND SUCTION CURETTAGE (N/A)  Consults: None  Significant Diagnostic Studies: Preoperative hemoglobin   Recent Labs Lab 06/18/13 1842 06/20/13 0549  WBC 12.3* 5.8  HGB 12.9 9.7*  HCT 36.2 26.8*  PLT 118* 86*     Hospital Course:  Carolyn [name] is a 27 y.o. [id number]  admitted for scheduled surgery.  She underwent the procedures as mentioned above, her operation was uncomplicated. For further details about surgery, please refer to the operative report. Patient had an uncomplicated postoperative course she received intravenous antibiotics overnight due to the malodor noted at the time of D&C. By time of discharge, her pain was controlled on oral pain medications; she was ambulating, voiding without difficulty, tolerating regular diet and passing flatus. She was deemed stable for discharge to home.   Discharge Exam: Blood pressure 92/51, pulse 53, temperature 97.5 F (36.4 C), temperature source Oral, resp. rate 16, height 5\' 7"  (1.702 m), weight 203 lb (92.08 kg), SpO2 98.00%. GI: soft, non-tender; bowel sounds normal; no masses,  no organomegaly  Discharged Condition: good  Disposition: 01-Home or Self Care     Medication List    ASK your doctor about these medications       acetaminophen 500 MG tablet  Commonly known as:  TYLENOL  Take 1,000 mg by mouth as needed.     levothyroxine 200 MCG tablet  Commonly known as:  SYNTHROID, LEVOTHROID  Take 200 mcg by mouth daily.       doxycycline 100 mg by mouth twice a day x7 days Flagyl 500 mg by mouth twice a day x7 days Hydrocodone 5/325 #31-2 every 6 hours when necessary pain Motrin 600 mg every 6 hours   Signed: Tilda Burrow

## 2013-06-20 NOTE — Progress Notes (Signed)
Patient states understanding of discharge instructions, prescriptions given 

## 2013-06-21 ENCOUNTER — Encounter (HOSPITAL_COMMUNITY): Payer: Self-pay | Admitting: Obstetrics and Gynecology

## 2013-06-27 ENCOUNTER — Telehealth: Payer: Self-pay | Admitting: Obstetrics and Gynecology

## 2013-06-27 NOTE — Telephone Encounter (Signed)
Spoke with pt. Had a d and c last Tuesday. Needs note to go back to work. Spoke with Dr. Emelda Fear. Pt ok to return to work tomorrow. Note written and faxed to [phone]. JSY

## 2013-07-06 ENCOUNTER — Ambulatory Visit: Payer: Medicaid Other | Admitting: Obstetrics and Gynecology

## 2013-07-13 ENCOUNTER — Ambulatory Visit: Payer: Medicaid Other | Admitting: Obstetrics and Gynecology

## 2014-02-26 ENCOUNTER — Emergency Department (HOSPITAL_COMMUNITY)
Admission: EM | Admit: 2014-02-26 | Discharge: 2014-02-26 | Disposition: A | Discharge status: Home / Self Care | Payer: Medicaid Other | Attending: Emergency Medicine | Admitting: Emergency Medicine

## 2014-02-26 ENCOUNTER — Emergency Department (HOSPITAL_COMMUNITY): Payer: Medicaid Other

## 2014-02-26 ENCOUNTER — Encounter (HOSPITAL_COMMUNITY): Payer: Self-pay | Admitting: Emergency Medicine

## 2014-02-26 DIAGNOSIS — Y9389 Activity, other specified: Secondary | ICD-10-CM | POA: Insufficient documentation

## 2014-02-26 DIAGNOSIS — E0500 Thyrotoxicosis with diffuse goiter without thyrotoxic crisis or storm: Secondary | ICD-10-CM | POA: Insufficient documentation

## 2014-02-26 DIAGNOSIS — S39012A Strain of muscle, fascia and tendon of lower back, initial encounter: Secondary | ICD-10-CM

## 2014-02-26 DIAGNOSIS — S335XXA Sprain of ligaments of lumbar spine, initial encounter: Secondary | ICD-10-CM | POA: Insufficient documentation

## 2014-02-26 DIAGNOSIS — Z792 Long term (current) use of antibiotics: Secondary | ICD-10-CM | POA: Insufficient documentation

## 2014-02-26 DIAGNOSIS — Y92410 Unspecified street and highway as the place of occurrence of the external cause: Secondary | ICD-10-CM | POA: Insufficient documentation

## 2014-02-26 DIAGNOSIS — Z79899 Other long term (current) drug therapy: Secondary | ICD-10-CM | POA: Insufficient documentation

## 2014-02-26 MED ORDER — DEXAMETHASONE 6 MG PO TABS: ORAL_TABLET | ORAL | Status: DC

## 2014-02-26 MED ORDER — PREDNISONE 50 MG PO TABS
60.0000 mg | ORAL_TABLET | Freq: Once | ORAL | Status: AC
  Administered 2014-02-26: 60 mg via ORAL
  Filled 2014-02-26 (×2): qty 1

## 2014-02-26 MED ORDER — DIAZEPAM 5 MG PO TABS
5.0000 mg | ORAL_TABLET | Freq: Once | ORAL | Status: AC
  Administered 2014-02-26: 5 mg via ORAL
  Filled 2014-02-26: qty 1

## 2014-02-26 MED ORDER — ONDANSETRON HCL 4 MG PO TABS
4.0000 mg | ORAL_TABLET | Freq: Once | ORAL | Status: AC
  Administered 2014-02-26: 4 mg via ORAL
  Filled 2014-02-26: qty 1

## 2014-02-26 MED ORDER — HYDROCODONE-ACETAMINOPHEN 5-325 MG PO TABS
1.0000 | ORAL_TABLET | ORAL | Status: DC | PRN
Start: 2014-02-26 — End: 2014-08-19

## 2014-02-26 MED ORDER — HYDROCODONE-ACETAMINOPHEN 5-325 MG PO TABS
2.0000 | ORAL_TABLET | Freq: Once | ORAL | Status: AC
  Administered 2014-02-26: 2 via ORAL
  Filled 2014-02-26: qty 2

## 2014-02-26 MED ORDER — METHOCARBAMOL 500 MG PO TABS: 500.0000 mg | ORAL_TABLET | Freq: Three times a day (TID) | ORAL | Status: DC

## 2014-02-26 NOTE — ED Provider Notes (Signed)
Medical screening examination/treatment/procedure(s) were performed by non-physician practitioner and as supervising physician I was immediately available for consultation/collaboration.   EKG Interpretation None        Joya Gaskinsonald W Lovinia Snare, MD 02/26/14 [phone]
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
## 2014-02-26 NOTE — Discharge Instructions (Signed)
Your x-ray is negative for fracture or subluxation. Please apply heat to your back. Please rest your back is much as possible. Please use Decadron and Robaxin daily. Use Norco for pain if needed. Norco and Robaxin may cause drowsiness, please use with caution. Lumbosacral Strain Lumbosacral strain is a strain of any of the parts that make up your lumbosacral vertebrae. Your lumbosacral vertebrae are the bones that make up the lower third of your backbone. Your lumbosacral vertebrae are held together by muscles and tough, fibrous tissue (ligaments).  CAUSES  A sudden blow to your back can cause lumbosacral strain. Also, anything that causes an excessive stretch of the muscles in the low back can cause this strain. This is typically seen when people exert themselves strenuously, fall, lift heavy objects, bend, or crouch repeatedly. RISK FACTORS  Physically demanding work.  Participation in pushing or pulling sports or sports that require sudden twist of the back (tennis, golf, baseball).  Weight lifting.  Excessive lower back curvature.  Forward-tilted pelvis.  Weak back or abdominal muscles or both.  Tight hamstrings. SIGNS AND SYMPTOMS  Lumbosacral strain may cause pain in the area of your injury or pain that moves (radiates) down your leg.  DIAGNOSIS Your health care provider can often diagnose lumbosacral strain through a physical exam. In some cases, you may need tests such as X-ray exams.  TREATMENT  Treatment for your lower back injury depends on many factors that your clinician will have to evaluate. However, most treatment will include the use of anti-inflammatory medicines. HOME CARE INSTRUCTIONS   Avoid hard physical activities (tennis, racquetball, waterskiing) if you are not in proper physical condition for it. This may aggravate or create problems.  If you have a back problem, avoid sports requiring sudden body movements. Swimming and walking are generally safer  activities.  Maintain good posture.  Maintain a healthy weight.  For acute conditions, you may put ice on the injured area.  Put ice in a plastic bag.  Place a towel between your skin and the bag.  Leave the ice on for 20 minutes, 2 3 times a day.  When the low back starts healing, stretching and strengthening exercises may be recommended. SEEK MEDICAL CARE IF:  Your back pain is getting worse.  You experience severe back pain not relieved with medicines. SEEK IMMEDIATE MEDICAL CARE IF:   You have numbness, tingling, weakness, or problems with the use of your arms or legs.  There is a change in bowel or bladder control.  You have increasing pain in any area of the body, including your belly (abdomen).  You notice shortness of breath, dizziness, or feel faint.  You feel sick to your stomach (nauseous), are throwing up (vomiting), or become sweaty.  You notice discoloration of your toes or legs, or your feet get very cold. MAKE SURE YOU:   Understand these instructions.  Will watch your condition.  Will get help right away if you are not doing well or get worse. Document Released: 09/08/2005 Document Revised: 09/19/2013 Document Reviewed: 07/18/2013 [facility] Patient Information 2014 [place].
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
## 2014-04-18 ENCOUNTER — Emergency Department: Payer: Self-pay | Admitting: Emergency Medicine

## 2014-04-18 LAB — COMPREHENSIVE METABOLIC PANEL
ALBUMIN: 4.6 g/dL (ref 3.4–5.0)
ANION GAP: 5 — AB (ref 7–16)
Alkaline Phosphatase: 66 U/L
BILIRUBIN TOTAL: 0.7 mg/dL (ref 0.2–1.0)
BUN: 11 mg/dL (ref 7–18)
CALCIUM: 9.5 mg/dL (ref 8.5–10.1)
Chloride: 107 mmol/L (ref 98–107)
Co2: 26 mmol/L (ref 21–32)
Creatinine: 0.97 mg/dL (ref 0.60–1.30)
EGFR (African American): >60
Glucose: 86 mg/dL (ref 65–99)
Osmolality: 274 (ref 275–301)
POTASSIUM: 4.2 mmol/L (ref 3.5–5.1)
SGOT(AST): 24 U/L (ref 15–37)
SGPT (ALT): 18 U/L (ref 12–78)
Sodium: 138 mmol/L (ref 136–145)
Total Protein: 8.5 g/dL — ABNORMAL HIGH (ref 6.4–8.2)

## 2014-04-18 LAB — DRUG SCREEN, URINE
AMPHETAMINES, UR SCREEN: NEGATIVE (ref ?–1000)
BARBITURATES, UR SCREEN: NEGATIVE (ref ?–200)
BENZODIAZEPINE, UR SCRN: NEGATIVE (ref ?–200)
COCAINE METABOLITE, UR ~~LOC~~: NEGATIVE (ref ?–300)
Cannabinoid 50 Ng, Ur ~~LOC~~: NEGATIVE (ref ?–50)
MDMA (Ecstasy)Ur Screen: NEGATIVE (ref ?–500)
Methadone, Ur Screen: NEGATIVE (ref ?–300)
Opiate, Ur Screen: POSITIVE (ref ?–300)
PHENCYCLIDINE (PCP) UR S: NEGATIVE (ref ?–25)
Tricyclic, Ur Screen: NEGATIVE (ref ?–1000)

## 2014-04-18 LAB — URINALYSIS, COMPLETE
BACTERIA: NONE SEEN
Bilirubin,UR: NEGATIVE
GLUCOSE, UR: NEGATIVE mg/dL (ref 0–75)
Hyaline Cast: 4
KETONE: NEGATIVE
NITRITE: NEGATIVE
Ph: 5 (ref 4.5–8.0)
Protein: NEGATIVE
Specific Gravity: 1.023 (ref 1.003–1.030)
Squamous Epithelial: 1
WBC UR: 56 /HPF (ref 0–5)

## 2014-04-18 LAB — CBC
HCT: 40.7 % (ref 35.0–47.0)
HGB: 14 g/dL (ref 12.0–16.0)
MCH: 29.5 pg (ref 26.0–34.0)
MCHC: 34.5 g/dL (ref 32.0–36.0)
MCV: 86 fL (ref 80–100)
PLATELETS: 180 10*3/uL (ref 150–440)
RBC: 4.76 10*6/uL (ref 3.80–5.20)
RDW: 13.9 % (ref 11.5–14.5)
WBC: 9.8 10*3/uL (ref 3.6–11.0)

## 2014-04-18 LAB — ACETAMINOPHEN LEVEL

## 2014-04-18 LAB — SALICYLATE LEVEL: Salicylates, Serum: <1.7 mg/dL

## 2014-04-18 LAB — ETHANOL
Ethanol %: <0.003 % (ref 0.000–0.080)
Ethanol: <3 mg/dL

## 2014-04-18 LAB — TSH: Thyroid Stimulating Horm: 69.3 u[IU]/mL — ABNORMAL HIGH

## 2014-08-19 ENCOUNTER — Emergency Department (HOSPITAL_COMMUNITY): Payer: Medicaid Other

## 2014-08-19 ENCOUNTER — Emergency Department (HOSPITAL_COMMUNITY)
Admission: EM | Admit: 2014-08-19 | Discharge: 2014-08-19 | Disposition: A | Discharge status: Home / Self Care | Payer: Medicaid Other | Attending: Emergency Medicine | Admitting: Emergency Medicine

## 2014-08-19 ENCOUNTER — Encounter (HOSPITAL_COMMUNITY): Payer: Self-pay | Admitting: Emergency Medicine

## 2014-08-19 DIAGNOSIS — Z3202 Encounter for pregnancy test, result negative: Secondary | ICD-10-CM | POA: Diagnosis not present

## 2014-08-19 DIAGNOSIS — R21 Rash and other nonspecific skin eruption: Secondary | ICD-10-CM | POA: Diagnosis not present

## 2014-08-19 DIAGNOSIS — M549 Dorsalgia, unspecified: Secondary | ICD-10-CM | POA: Insufficient documentation

## 2014-08-19 DIAGNOSIS — Z791 Long term (current) use of non-steroidal anti-inflammatories (NSAID): Secondary | ICD-10-CM | POA: Diagnosis not present

## 2014-08-19 DIAGNOSIS — R1030 Lower abdominal pain, unspecified: Secondary | ICD-10-CM

## 2014-08-19 DIAGNOSIS — Z87440 Personal history of urinary (tract) infections: Secondary | ICD-10-CM | POA: Diagnosis not present

## 2014-08-19 DIAGNOSIS — R1031 Right lower quadrant pain: Secondary | ICD-10-CM | POA: Insufficient documentation

## 2014-08-19 DIAGNOSIS — E079 Disorder of thyroid, unspecified: Secondary | ICD-10-CM | POA: Diagnosis not present

## 2014-08-19 DIAGNOSIS — Z79899 Other long term (current) drug therapy: Secondary | ICD-10-CM | POA: Insufficient documentation

## 2014-08-19 DIAGNOSIS — Z8742 Personal history of other diseases of the female genital tract: Secondary | ICD-10-CM | POA: Insufficient documentation

## 2014-08-19 HISTORY — DX: Unspecified ovarian cyst, unspecified side: N83.209

## 2014-08-19 LAB — CBC WITH DIFFERENTIAL/PLATELET
BASOS ABS: 0.1 10*3/uL (ref 0.0–0.1)
BASOS PCT: 1 % (ref 0–1)
EOS ABS: 0.2 10*3/uL (ref 0.0–0.7)
Eosinophils Relative: 3 % (ref 0–5)
HCT: 41.9 % (ref 36.0–46.0)
HEMOGLOBIN: 14.7 g/dL (ref 12.0–15.0)
Lymphocytes Relative: 33 % (ref 12–46)
Lymphs Abs: 2.4 10*3/uL (ref 0.7–4.0)
MCH: 28.8 pg (ref 26.0–34.0)
MCHC: 35.1 g/dL (ref 30.0–36.0)
MCV: 82.2 fL (ref 78.0–100.0)
Monocytes Absolute: 0.2 10*3/uL (ref 0.1–1.0)
Monocytes Relative: 3 % (ref 3–12)
NEUTROS ABS: 4.4 10*3/uL (ref 1.7–7.7)
NEUTROS PCT: 60 % (ref 43–77)
PLATELETS: 223 10*3/uL (ref 150–400)
RBC: 5.1 MIL/uL (ref 3.87–5.11)
RDW: 13.1 % (ref 11.5–15.5)
WBC: 7.2 10*3/uL (ref 4.0–10.5)

## 2014-08-19 LAB — COMPREHENSIVE METABOLIC PANEL
ALBUMIN: 4.6 g/dL (ref 3.5–5.2)
ALK PHOS: 51 U/L (ref 39–117)
ALT: 18 U/L (ref 0–35)
ANION GAP: 13 (ref 5–15)
AST: 21 U/L (ref 0–37)
BILIRUBIN TOTAL: 0.5 mg/dL (ref 0.3–1.2)
BUN: 7 mg/dL (ref 6–23)
CHLORIDE: 104 meq/L (ref 96–112)
CO2: 23 mEq/L (ref 19–32)
Calcium: 9.3 mg/dL (ref 8.4–10.5)
Creatinine, Ser: 0.99 mg/dL (ref 0.50–1.10)
GFR calc Af Amer: >90 mL/min (ref >90)
GFR calc non Af Amer: 78 mL/min — ABNORMAL LOW (ref >90)
Glucose, Bld: 92 mg/dL (ref 70–99)
POTASSIUM: 3.7 meq/L (ref 3.7–5.3)
SODIUM: 140 meq/L (ref 137–147)
TOTAL PROTEIN: 8 g/dL (ref 6.0–8.3)

## 2014-08-19 LAB — URINALYSIS, ROUTINE W REFLEX MICROSCOPIC
Bilirubin Urine: NEGATIVE
GLUCOSE, UA: NEGATIVE mg/dL
Hgb urine dipstick: NEGATIVE
KETONES UR: NEGATIVE mg/dL
LEUKOCYTES UA: NEGATIVE
NITRITE: NEGATIVE
PROTEIN: NEGATIVE mg/dL
Specific Gravity, Urine: <1.005 — ABNORMAL LOW (ref 1.005–1.030)
Urobilinogen, UA: 0.2 mg/dL (ref 0.0–1.0)
pH: 5.5 (ref 5.0–8.0)

## 2014-08-19 LAB — LIPASE, BLOOD: Lipase: 25 U/L (ref 11–59)

## 2014-08-19 LAB — PREGNANCY, URINE: PREG TEST UR: NEGATIVE

## 2014-08-19 MED ORDER — OXYCODONE-ACETAMINOPHEN 5-325 MG PO TABS
1.0000 | ORAL_TABLET | Freq: Once | ORAL | Status: AC
  Administered 2014-08-19: 1 via ORAL
  Filled 2014-08-19: qty 1

## 2014-08-19 MED ORDER — DIPHENHYDRAMINE HCL 25 MG PO CAPS
25.0000 mg | ORAL_CAPSULE | Freq: Once | ORAL | Status: AC
  Administered 2014-08-19: 25 mg via ORAL
  Filled 2014-08-19: qty 1

## 2014-08-19 MED ORDER — HYDROCODONE-ACETAMINOPHEN 5-325 MG PO TABS: 1.0000 | ORAL_TABLET | Freq: Four times a day (QID) | ORAL | Status: DC | PRN

## 2014-08-19 MED ORDER — HYDROCODONE-ACETAMINOPHEN 5-325 MG PO TABS
2.0000 | ORAL_TABLET | Freq: Once | ORAL | Status: AC
  Administered 2014-08-19: 2 via ORAL
  Filled 2014-08-19: qty 2

## 2014-08-19 NOTE — ED Provider Notes (Signed)
CSN: [id number]     Arrival date & time 08/19/14  1145 History  This chart was scribed for Benny Lennert, MD by Milly Jakob, ED Scribe. The patient was seen in room [facility]. Patient's care was started at 1:07 PM.   Chief Complaint  Patient presents with  . Abdominal Pain   Patient is a 28 y.o. female presenting with abdominal pain. The history is provided by the patient. No language interpreter was used.  Abdominal Pain Pain location:  RLQ Pain quality: cramping and sharp   Pain radiates to:  Does not radiate Pain severity:  Moderate Onset quality:  Gradual Duration:  2 weeks Timing:  Constant Progression:  Unchanged Chronicity:  New Relieved by:  Nothing Worsened by:  Nothing tried Ineffective treatments:  None tried Associated symptoms: no chest pain, no cough, no diarrhea, no fatigue and no hematuria   Risk factors: NSAID use    HPI Comments: Carolyn [name] is a 28 y.o. female who presents to the Emergency Department complaining of constant, severe, RLQ abdominal pain onset three weeks ago. She additionally reports shooting lower back pain that radiates into her right leg. She reports a non-itching rash on her face and upper chest that began here in the ED. She denies having any medications here so far. She states that she was seen in [place] 2 weeks ago and diagnosed with a UTI for which she was prescribed ABX. She reports that her UTI is now resolved after taking her ABX, but that the abdominal pain did not improve. She reports being unable to see her PCP for this, because she does not currently have health insurance.   PCP: Alleen Borne  Past Medical History  Diagnosis Date  . Thyroid disease   . Grave's disease   . Pregnancy   . Ovarian cyst    Past Surgical History  Procedure Laterality Date  . Thyroid surgery    . Fracture surgery    . Dilation and curettage of uterus    . Dilation and curettage of uterus  05/10/2012    Procedure: DILATATION AND  CURETTAGE;  Surgeon: Tilda Burrow, MD;  Location: [facility];  Service: Gynecology;  Laterality: N/A;  suction  . Iud removal  05/10/2012    Procedure: INTRAUTERINE DEVICE (IUD) REMOVAL;  Surgeon: Tilda Burrow, MD;  Location: [facility];  Service: Gynecology;;  . Ankle surgery Left     pin put in  . Dilation and curettage of uterus N/A 06/19/2013    Procedure: DILATATION AND SUCTION CURETTAGE;  Surgeon: Tilda Burrow, MD;  Location: [facility];  Service: Gynecology;  Laterality: N/A;   Family History  Problem Relation Age of Onset  . Anesthesia problems Neg Hx   . Hypotension Neg Hx   . Malignant hyperthermia Neg Hx   . Pseudochol deficiency Neg Hx   . Diabetes Mother   . Hypertension Mother   . Hyperlipidemia Mother   . Hypertension Father   . Hyperlipidemia Father   . Cancer Maternal Grandmother     uterine   . Heart disease Maternal Grandfather    History  Substance Use Topics  . Smoking status: Never Smoker   . Smokeless tobacco: Never Used  . Alcohol Use: Yes     Comment: social   OB History   Grav Para Term Preterm Abortions TAB SAB Ect Mult Living   Review of Systems  Constitutional:  Negative for appetite change and fatigue.  HENT: Negative for congestion, ear discharge and sinus pressure.   Eyes: Negative for discharge.  Respiratory: Negative for cough.   Cardiovascular: Negative for chest pain.  Gastrointestinal: Positive for abdominal pain. Negative for diarrhea.  Genitourinary: Negative for frequency and hematuria.  Musculoskeletal: Positive for back pain.  Skin: Positive for rash.  Neurological: Negative for seizures and headaches.  Psychiatric/Behavioral: Negative for hallucinations.   Allergies  Aleve and Iodine  Home Medications   Prior to Admission medications   Medication Sig Start Date End Date Taking? Authorizing Provider  ibuprofen (ADVIL,MOTRIN) 200 MG tablet Take 400 mg by mouth every 6 (six) hours as needed for moderate pain.    Yes Historical Provider, MD  levothyroxine (SYNTHROID, LEVOTHROID) 300 MCG tablet Take 300 mcg by mouth daily before breakfast.   Yes Historical Provider, MD  medroxyPROGESTERone (DEPO-PROVERA) 150 MG/ML injection Inject 150 mg into the muscle every 3 (three) months.   Yes Historical Provider, MD   Triage vitals: BP 137/99  Pulse 91  Temp(Src) 99.6 F (37.6 C) (Oral)  Resp 18  Ht  (1.702 m)  Wt 220 lb (99.791 kg)  BMI 34.45 kg/m2  SpO2 99%  LMP 08/05/2014 Physical Exam  Constitutional: She is oriented to person, place, and time. She appears well-developed.  HENT:  Head: Normocephalic.  Eyes: Conjunctivae and EOM are normal. No scleral icterus.  Neck: Neck supple. No thyromegaly present.  Cardiovascular: Normal rate and regular rhythm.  Exam reveals no gallop and no friction rub.   No murmur heard. Pulmonary/Chest: No stridor. She has no wheezes. She has no rales. She exhibits no tenderness.  Abdominal: She exhibits no distension. There is tenderness (moderate, RLQ). There is no rebound.  Musculoskeletal: Normal range of motion. She exhibits no edema.  Lymphadenopathy:    She has no cervical adenopathy.  Neurological: She is oriented to person, place, and time. She exhibits normal muscle tone. Coordination normal.  Skin: No rash noted. No erythema.  Psychiatric: She has a normal mood and affect. Her behavior is normal.    ED Course  Procedures (including critical care time) DIAGNOSTIC STUDIES: Oxygen Saturation is 99% on room air, normal by my interpretation.    COORDINATION OF CARE: 1:11 PM-Discussed treatment plan which includes pain medication and X-Ray with pt at bedside and pt agreed to plan.   Labs Review Labs Reviewed  URINALYSIS, ROUTINE W REFLEX MICROSCOPIC - Abnormal; Notable for the following:    Specific Gravity, Urine <1.005 (*)    All other components within normal limits  COMPREHENSIVE METABOLIC PANEL - Abnormal; Notable for the following:    GFR calc  non Af Amer 78 (*)    All other components within normal limits  PREGNANCY, URINE  CBC WITH DIFFERENTIAL  LIPASE, BLOOD    Imaging Review No results found.   EKG Interpretation None      MDM   Final diagnoses:  None     abd pain,  Possible ovarian cyst.  tx with vicodin and follow up  The chart was scribed for me under my direct supervision.  I personally performed the history, physical, and medical decision making and all procedures in the evaluation of this patient.Benny Lennert, MD 08/19/14 [id number]

## 2014-08-19 NOTE — ED Notes (Signed)
Pt presents to the ED c/o of RLQ abdominal pain radiating to lower back. Pt states she was seen in [place] 3 weeks ago and treated her a UTI. Pt states she went back 2 weeks ago and states "they said UTI had cleared up." Pt states she took all her antibiotics, but pain did not improve.

## 2014-08-19 NOTE — Discharge Instructions (Signed)
Follow up if not improving

## 2014-08-19 NOTE — ED Notes (Addendum)
Patient's rash has resolved. There is no longer any red color on chest or arms. Patient resting quietly lying in the bed.

## 2014-08-23 ENCOUNTER — Encounter: Payer: Self-pay | Admitting: Obstetrics & Gynecology

## 2014-08-23 ENCOUNTER — Ambulatory Visit (INDEPENDENT_AMBULATORY_CARE_PROVIDER_SITE_OTHER): Payer: Self-pay | Admitting: Obstetrics & Gynecology

## 2014-08-23 VITALS — BP 120/80 | Ht 67.0 in | Wt 216.0 lb

## 2014-08-23 DIAGNOSIS — R1031 Right lower quadrant pain: Secondary | ICD-10-CM

## 2014-08-23 DIAGNOSIS — R509 Fever, unspecified: Secondary | ICD-10-CM

## 2014-08-23 DIAGNOSIS — R110 Nausea: Secondary | ICD-10-CM

## 2014-08-23 MED ORDER — HYDROCODONE-ACETAMINOPHEN 5-325 MG PO TABS: 1.0000 | ORAL_TABLET | Freq: Four times a day (QID) | ORAL | Status: DC | PRN

## 2014-08-23 MED ORDER — DOXYCYCLINE HYCLATE 50 MG PO CAPS: 100.0000 mg | ORAL_CAPSULE | Freq: Two times a day (BID) | ORAL | Status: DC

## 2014-08-23 NOTE — Progress Notes (Signed)
Patient ID: Carolyn [name], female   DOB: [birth year], 28 y.o.   MRN: [id number] CLINICAL DATA: Right lower quadrant pain with fever and nausea.  EXAM:  CT ABDOMEN AND PELVIS WITHOUT CONTRAST  TECHNIQUE:  Multidetector CT imaging of the abdomen and pelvis was performed  following the standard protocol without IV contrast.  COMPARISON: 02/04/2005.  FINDINGS:  Lung bases show scattered nondependent peribronchovascular  ground-glass, primarily in the right middle lobe and lingula. Heart  size normal. No pericardial or pleural effusion.  Liver, gallbladder, adrenal glands, kidneys, spleen, pancreas,  stomach and bowel, including appendix, are unremarkable. No  pathologically enlarged lymph nodes. No free fluid. Uterus and  ovaries are visualized. Bladder is unremarkable.  IMPRESSION:  1. No acute findings to explain the patient's given symptoms. No  evidence of appendicitis.  2. Scattered peribronchovascular ground-glass in the right middle  lobe and lingula, likely infectious or inflammatory in etiology.  Electronically Signed  By: Leanna Battles M.D.  On: 08/19/2014 14:11  No evidnec of gyn anatomical issue Will treat empirically iwtrh doxycycline and follow up in 3 weeks

## 2014-09-13 ENCOUNTER — Ambulatory Visit: Payer: Medicaid Other | Admitting: Obstetrics & Gynecology

## 2014-09-23 ENCOUNTER — Inpatient Hospital Stay (HOSPITAL_COMMUNITY)
Admission: EM | Admit: 2014-09-23 | Discharge: 2014-09-24 | DRG: 644 | Disposition: A | Discharge status: Home / Self Care | Payer: Medicaid Other | Attending: Internal Medicine | Admitting: Internal Medicine

## 2014-09-23 ENCOUNTER — Emergency Department (HOSPITAL_COMMUNITY): Payer: Medicaid Other

## 2014-09-23 ENCOUNTER — Encounter (HOSPITAL_COMMUNITY): Payer: Self-pay | Admitting: Emergency Medicine

## 2014-09-23 DIAGNOSIS — E876 Hypokalemia: Secondary | ICD-10-CM | POA: Diagnosis present

## 2014-09-23 DIAGNOSIS — Z8249 Family history of ischemic heart disease and other diseases of the circulatory system: Secondary | ICD-10-CM

## 2014-09-23 DIAGNOSIS — Z808 Family history of malignant neoplasm of other organs or systems: Secondary | ICD-10-CM | POA: Diagnosis not present

## 2014-09-23 DIAGNOSIS — N179 Acute kidney failure, unspecified: Secondary | ICD-10-CM | POA: Diagnosis present

## 2014-09-23 DIAGNOSIS — R55 Syncope and collapse: Secondary | ICD-10-CM

## 2014-09-23 DIAGNOSIS — E890 Postprocedural hypothyroidism: Principal | ICD-10-CM

## 2014-09-23 DIAGNOSIS — Z833 Family history of diabetes mellitus: Secondary | ICD-10-CM | POA: Diagnosis not present

## 2014-09-23 DIAGNOSIS — R197 Diarrhea, unspecified: Secondary | ICD-10-CM

## 2014-09-23 DIAGNOSIS — Z6832 Body mass index (BMI) 32.0-32.9, adult: Secondary | ICD-10-CM

## 2014-09-23 DIAGNOSIS — E669 Obesity, unspecified: Secondary | ICD-10-CM | POA: Diagnosis present

## 2014-09-23 DIAGNOSIS — I951 Orthostatic hypotension: Secondary | ICD-10-CM | POA: Diagnosis present

## 2014-09-23 DIAGNOSIS — R079 Chest pain, unspecified: Secondary | ICD-10-CM

## 2014-09-23 DIAGNOSIS — O200 Threatened abortion: Secondary | ICD-10-CM

## 2014-09-23 DIAGNOSIS — E038 Other specified hypothyroidism: Secondary | ICD-10-CM

## 2014-09-23 DIAGNOSIS — O030 Genital tract and pelvic infection following incomplete spontaneous abortion: Secondary | ICD-10-CM

## 2014-09-23 DIAGNOSIS — E034 Atrophy of thyroid (acquired): Secondary | ICD-10-CM

## 2014-09-23 HISTORY — DX: Acute kidney failure, unspecified: N17.9

## 2014-09-23 LAB — CBC WITH DIFFERENTIAL/PLATELET
Basophils Absolute: 0.1 10*3/uL (ref 0.0–0.1)
Basophils Relative: 1 % (ref 0–1)
EOS ABS: 0.2 10*3/uL (ref 0.0–0.7)
Eosinophils Relative: 2 % (ref 0–5)
HEMATOCRIT: 41.5 % (ref 36.0–46.0)
HEMOGLOBIN: 14.4 g/dL (ref 12.0–15.0)
LYMPHS ABS: 4.1 10*3/uL — AB (ref 0.7–4.0)
Lymphocytes Relative: 43 % (ref 12–46)
MCH: 28.6 pg (ref 26.0–34.0)
MCHC: 34.7 g/dL (ref 30.0–36.0)
MCV: 82.5 fL (ref 78.0–100.0)
MONO ABS: 0.4 10*3/uL (ref 0.1–1.0)
MONOS PCT: 4 % (ref 3–12)
NEUTROS ABS: 4.8 10*3/uL (ref 1.7–7.7)
NEUTROS PCT: 50 % (ref 43–77)
Platelets: 222 10*3/uL (ref 150–400)
RBC: 5.03 MIL/uL (ref 3.87–5.11)
RDW: 14.1 % (ref 11.5–15.5)
WBC: 9.5 10*3/uL (ref 4.0–10.5)

## 2014-09-23 LAB — MAGNESIUM: Magnesium: 2.1 mg/dL (ref 1.5–2.5)

## 2014-09-23 LAB — RAPID URINE DRUG SCREEN, HOSP PERFORMED
Amphetamines: NOT DETECTED
BENZODIAZEPINES: NOT DETECTED
Barbiturates: NOT DETECTED
COCAINE: NOT DETECTED
Opiates: NOT DETECTED
Tetrahydrocannabinol: NOT DETECTED

## 2014-09-23 LAB — BASIC METABOLIC PANEL
Anion gap: 17 — ABNORMAL HIGH (ref 5–15)
BUN: 11 mg/dL (ref 6–23)
CHLORIDE: 103 meq/L (ref 96–112)
CO2: 20 mEq/L (ref 19–32)
CREATININE: 1.2 mg/dL — AB (ref 0.50–1.10)
Calcium: 9.7 mg/dL (ref 8.4–10.5)
GFR calc Af Amer: 71 mL/min — ABNORMAL LOW (ref >90)
GFR calc non Af Amer: 61 mL/min — ABNORMAL LOW (ref >90)
GLUCOSE: 98 mg/dL (ref 70–99)
POTASSIUM: 3.4 meq/L — AB (ref 3.7–5.3)
Sodium: 140 mEq/L (ref 137–147)

## 2014-09-23 LAB — URINALYSIS, ROUTINE W REFLEX MICROSCOPIC
BILIRUBIN URINE: NEGATIVE
GLUCOSE, UA: NEGATIVE mg/dL
HGB URINE DIPSTICK: NEGATIVE
KETONES UR: NEGATIVE mg/dL
Nitrite: NEGATIVE
PROTEIN: NEGATIVE mg/dL
Specific Gravity, Urine: 1.02 (ref 1.005–1.030)
UROBILINOGEN UA: 0.2 mg/dL (ref 0.0–1.0)
pH: 6 (ref 5.0–8.0)

## 2014-09-23 LAB — TSH: TSH: >100 u[IU]/mL — ABNORMAL HIGH (ref 0.350–4.500)

## 2014-09-23 LAB — CORTISOL: Cortisol, Plasma: 10.9 ug/dL

## 2014-09-23 LAB — TROPONIN I
Troponin I: <0.3 ng/mL (ref <0.30)
Troponin I: <0.3 ng/mL (ref <0.30)

## 2014-09-23 LAB — T4, FREE: Free T4: 0.24 ng/dL — ABNORMAL LOW (ref 0.80–1.80)

## 2014-09-23 LAB — URINE MICROSCOPIC-ADD ON

## 2014-09-23 LAB — PHOSPHORUS: Phosphorus: 4 mg/dL (ref 2.3–4.6)

## 2014-09-23 LAB — POC URINE PREG, ED: Preg Test, Ur: NEGATIVE

## 2014-09-23 MED ORDER — LEVOTHYROXINE SODIUM 75 MCG PO TABS
150.0000 ug | ORAL_TABLET | Freq: Every day | ORAL | Status: DC
  Administered 2014-09-23 – 2014-09-24 (×2): 150 ug via ORAL
  Filled 2014-09-23 (×2): qty 2

## 2014-09-23 MED ORDER — SODIUM CHLORIDE 0.9 % IV SOLN
INTRAVENOUS | Status: DC
Start: 2014-09-23 — End: 2014-09-24
  Administered 2014-09-23 – 2014-09-24 (×2): via INTRAVENOUS

## 2014-09-23 MED ORDER — ALUM & MAG HYDROXIDE-SIMETH 200-200-20 MG/5ML PO SUSP: 30.0000 mL | Freq: Four times a day (QID) | ORAL | Status: DC | PRN

## 2014-09-23 MED ORDER — LEVOTHYROXINE SODIUM 100 MCG IV SOLR
25.0000 ug | Freq: Once | INTRAVENOUS | Status: DC
  Filled 2014-09-23: qty 5

## 2014-09-23 MED ORDER — HYDRALAZINE HCL 20 MG/ML IJ SOLN: 10.0000 mg | Freq: Four times a day (QID) | INTRAMUSCULAR | Status: DC | PRN

## 2014-09-23 MED ORDER — OXYCODONE HCL 5 MG PO TABS
5.0000 mg | ORAL_TABLET | ORAL | Status: DC | PRN
  Administered 2014-09-23 – 2014-09-24 (×3): 5 mg via ORAL
  Filled 2014-09-23 (×3): qty 1

## 2014-09-23 MED ORDER — ACETAMINOPHEN 325 MG PO TABS: 650.0000 mg | ORAL_TABLET | Freq: Four times a day (QID) | ORAL | Status: DC | PRN

## 2014-09-23 MED ORDER — LEVOTHYROXINE SODIUM 150 MCG PO TABS
300.0000 ug | ORAL_TABLET | Freq: Every day | ORAL | Status: DC
  Filled 2014-09-23 (×2): qty 2

## 2014-09-23 MED ORDER — ONDANSETRON HCL 4 MG PO TABS
4.0000 mg | ORAL_TABLET | Freq: Four times a day (QID) | ORAL | Status: DC | PRN
  Administered 2014-09-23: 4 mg via ORAL
  Filled 2014-09-23: qty 1

## 2014-09-23 MED ORDER — POTASSIUM CHLORIDE CRYS ER 20 MEQ PO TBCR
40.0000 meq | EXTENDED_RELEASE_TABLET | Freq: Once | ORAL | Status: AC
  Administered 2014-09-23: 40 meq via ORAL
  Filled 2014-09-23: qty 2

## 2014-09-23 MED ORDER — SODIUM CHLORIDE 0.9 % IV SOLN: Freq: Once | INTRAVENOUS | Status: DC

## 2014-09-23 MED ORDER — ACETAMINOPHEN 650 MG RE SUPP: 650.0000 mg | Freq: Four times a day (QID) | RECTAL | Status: DC | PRN

## 2014-09-23 MED ORDER — ENOXAPARIN SODIUM 40 MG/0.4ML ~~LOC~~ SOLN
40.0000 mg | SUBCUTANEOUS | Status: DC
  Administered 2014-09-23 – 2014-09-24 (×2): 40 mg via SUBCUTANEOUS
  Filled 2014-09-23 (×2): qty 0.4

## 2014-09-23 MED ORDER — ONDANSETRON HCL 4 MG/2ML IJ SOLN: 4.0000 mg | Freq: Four times a day (QID) | INTRAMUSCULAR | Status: DC | PRN

## 2014-09-23 MED ORDER — HYDROMORPHONE HCL 1 MG/ML IJ SOLN
0.5000 mg | INTRAMUSCULAR | Status: DC | PRN
  Administered 2014-09-23 – 2014-09-24 (×5): 1 mg via INTRAVENOUS
  Filled 2014-09-23 (×5): qty 1

## 2014-09-23 MED ORDER — SODIUM CHLORIDE 0.9 % IJ SOLN
3.0000 mL | Freq: Two times a day (BID) | INTRAMUSCULAR | Status: DC
  Administered 2014-09-24: 3 mL via INTRAVENOUS

## 2014-09-23 NOTE — Progress Notes (Signed)
Chart reviewed and patient examined.   Summary: 28 yo hx hypothyroid s/p thyroidectomy 2011 admitted for multiple syncopal episodes/chest pain/palpitiations presumably related to not taking synthroid for 3 months.   VSS. Afebrile. Await TSH, Free T3 and T4 and cortisol. Magnesium and phosphorus within limits of normal. Troponin negative x3. Chest xray unremarkable.    PE:  General: obese, well appearing, calm CV: RRR No MGR No LE edema Resp: normal effort BS clear bilaterally no wheeze Abdomen: obese soft +BS non-tender to palpation  Assessment/plan  1. Syncope: likely related to untreated hypothyroidism. No events on tele. Troponin neg x2.  Await orthostatics. Will gently hydrate as well.   2. Hypothyroidism: s/p thyroidectomy.  reports no synthroid for 3 months due to finances. TSH and Free T3 and T4 pending. Await endocrinology recommendations. Reports just getting approved for Medicaid and has appointment late November. Provide with IV synthroid x1 then 150mcg daily.   3. Chest pain: resolved this am. Troponin negative x2. No events on tele  4. Hypokalemia: reports diarrhea prior to admission. Will replete and recheck. GI pathogen pane. Denies recent antibiotics.    Lesle ChrisKaren M. Black, NP

## 2014-09-23 NOTE — Progress Notes (Signed)
Patient seen and examined. Agree with above note. Plan to start Synthroid at 150 mcg daily. Appreciate endocrinology's recommendations. Can likely discharge home in the morning.  Peggye PittEstela Hernandez, MD [facility] Pager: [phone]

## 2014-09-23 NOTE — Consult Note (Signed)
Carolyn RakesLeah R [name] MRN: [id number] DOB/AGE: 27/06/1986 27 y.o. Primary Care Physician:CLAGGETT,ELIN, PA-C Admit date: 09/23/2014 Chief Complaint: Postsurgical hypothyroidism HPI:   28 yr old female with following medical history. She is in hospital for syncopal attacks, currently on monitoring and workup. 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 She says she always had problem regulating her thyroid function tests , and recently she lost her insurance and went with out her LT4 for 3 months. [facility] , she says they measured her TSH and was "38". She gained 20 + lbs, feels fatigued. She denies any family hx of thyroid dysfunction. She has 1 396 yr old child.   Past Medical History  Diagnosis Date  . Thyroid disease   . Grave's disease   . Ovarian cyst   . Acute kidney injury     Past Surgical History  Procedure Laterality Date  . Thyroid surgery    . Fracture surgery    . Dilation and curettage of uterus    . Dilation and curettage of uterus  05/10/2012    Procedure: DILATATION AND CURETTAGE;  Surgeon: Tilda BurrowJohn V Ferguson, MD;  Location: [facility];  Service: Gynecology;  Laterality: N/A;  suction  . Iud removal  05/10/2012    Procedure: INTRAUTERINE DEVICE (IUD) REMOVAL;  Surgeon: Tilda BurrowJohn V Ferguson, MD;  Location: [facility];  Service: Gynecology;;  . Ankle surgery Left     pin put in  . Dilation and curettage of uterus N/A 06/19/2013    Procedure: DILATATION AND SUCTION CURETTAGE;  Surgeon: Tilda BurrowJohn V Ferguson, MD;  Location: [facility];  Service: Gynecology;  Laterality: N/A;    Family History  Problem Relation Age of Onset  . Anesthesia problems Neg Hx   . Hypotension Neg Hx   . Malignant hyperthermia Neg Hx   . Pseudochol deficiency Neg Hx   . Diabetes Mother   . Hypertension Mother   . Hyperlipidemia Mother   . Cancer Mother   . Hypertension Father   .  Hyperlipidemia Father   . Cancer Maternal Grandmother     uterine   . Heart disease Maternal Grandfather     Social History:  reports that she has never smoked. She has never used smokeless tobacco. She reports that she drinks alcohol. She reports that she does not use illicit drugs.   Allergies:  Allergies  Allergen Reactions  . Aleve [Naproxen Sodium] Nausea And Vomiting    'makes headaches worse"  . Iodine Other (See Comments)    Causes blackouts    Medications Prior to Admission  Medication Sig Dispense Refill  . Multiple Vitamins-Minerals (MULTIVITAMIN WITH MINERALS) tablet Take 1 tablet by mouth daily.      Marland Kitchen. ibuprofen (ADVIL,MOTRIN) 200 MG tablet Take 400 mg by mouth every 6 (six) hours as needed for moderate pain.      Marland Kitchen. levothyroxine (SYNTHROID, LEVOTHROID) 300 MCG tablet Take 300 mcg by mouth daily before breakfast.      . medroxyPROGESTERone (DEPO-PROVERA) 150 MG/ML injection Inject 150 mg into the muscle every 3 (three) months.           WUJ:WJXBJROS:apart from the symptoms mentioned above,there are no other symptoms referable to all systems reviewed.  Physical Exam: Blood pressure 126/87, pulse 84, temperature 99.2 F (37.3 C), temperature source Oral, resp. rate 20, height 5\' 7"  (1.702 m), weight 94.9 kg (209 lb  3.5 oz), last menstrual period 09/02/2014, SpO2 98.00%.  Gen: resting in her hospital bed, no acute distress. HEENT: no exophthalmost, no lid lag. Neck: post thyroidectomy scar Chest: CTAB CVS: RRR, no M/R/G Abd: Soft, NT Ext: no edema, no deformity CNS: non focal Skin: no rash , no hyperemia.      Recent Labs  09/23/14 0110  WBC 9.5  NEUTROABS 4.8  HGB 14.4  HCT 41.5  MCV 82.5  PLT 222    Recent Labs  09/23/14 0110  NA 140  K 3.4*  CL 103  CO2 20  GLUCOSE 98  BUN 11  CREATININE 1.20*  CALCIUM 9.7  MG 2.1  PHOS 4.0   No results found for this basename: AST, ALT, ALKPHOS, BILITOT, PROT, ALBUMIN,  in the last 72 hours No results  found for this or any previous visit (from the past 240 hour(s)).  Dg Chest Port 1 View  09/23/2014   CLINICAL DATA:  Mid 2 left-sided chest pain with exertion. Syncopal episode. Initial encounter.  EXAM: PORTABLE CHEST - 1 VIEW  COMPARISON:  None.  FINDINGS: Normal cardiac silhouette and mediastinal contours. Veiling opacities overlying the bilateral lower lungs are favored to represent overlying breast tissues. No discrete focal airspace opacities. No pleural effusion or pneumothorax. No evidence of edema. No acute osseus abnormalities.  IMPRESSION: No acute cardiopulmonary disease.   Electronically Signed   By: Simonne ComeJohn  Watts M.D.   On: 09/23/2014 01:28   Thyroid function tests are pending.  Impression:  Principal Problem:   Hypothyroidism Active Problems:   Syncope   Hypokalemia   Chest pain   Acute kidney injury   Diarrhea   Plan: Per her history, she has postsurgical hypothyroidism. Surgical pathology not available to review at this time. I will try to obtain records from Dr. Ollen GrossPaul's office as out patient. In the mean time, weight based levothyroxine dose should be started , approx 150mcg po qam. She will not need IV LT4, hence I will d/c. I will obtain free T4, TSH, free T3, TPO and TG antibodies. I will reevaluate in AM with new TFTs. It will take 7-10 days  For her thyroid hormones to reach steady state, which can be followed as out patient. If d/c is planned , she can be discharged on same dose of LT4 150mcg po qam. Given her presentation with syncope , i agree with work up for possible adrenal insufficiency although she does not have the typical presenting symptoms and signs of adrenal insifficiency. Pt reports that she will like to follow with me in [place], hence I will request for arrangement for out patient f/u in 2 weeks.  Purcell NailsGEBRESELASSIE Zhanna Melin, MD 09/23/2014, 1:21 PM

## 2014-09-23 NOTE — ED Notes (Signed)
[facility]

## 2014-09-23 NOTE — H&P (Signed)
[facility] Admission History and Physical       [facility] ZOX:[id number] DOB: 04/06/1986 DOA: 09/23/2014  Referring physician: EDP PCP: Alleen BorneLAGGETT,ELIN, PA-C  Specialists:   Chief Complaint:  Passed Out  HPI: Carolyn RakesLeah R [name] is a 28 y.o. female with a history of Hypothyroid S/P thyroidectomy in 2011 who presents to the ED with complaints of multiple episodes of passing out for the past 3 weeks, along with episodes of chest pain and palpitations.   She reports that she has been out of her medication for the past 3 months and did not have insurance and was not able to see her doctor or get her medications refilled despite trying.   She reports that she passed out 6 times this past week.   She reports that she has also had fatigue and wekaness and hair loss and cold intolerance and weight gain over the past 2 months.     Review of Systems:  Constitutional: No Weight Loss, +Weight Gain, +Night Sweats, Fevers, Chills, Dizziness, +Fatigue, or Generalized Weakness HEENT: No Headaches, Difficulty Swallowing,Tooth/Dental Problems,Sore Throat,  No Sneezing, Rhinitis, Ear Ache, Nasal Congestion, or Post Nasal Drip,  Cardio-vascular:  +Chest pain, Orthopnea, PND, Edema in Lower Extremities, Anasarca, Dizziness, +Palpitations  Resp: No Dyspnea, No DOE, No Cough, No Hemoptysis, No Wheezing.    GI: No Heartburn, Indigestion, Abdominal Pain, Nausea, Vomiting, Diarrhea, +Constipation, Hematemesis, Hematochezia, Melena, Change in Bowel Habits,  Loss of Appetite  GU: No Dysuria, Change in Color of Urine, No Urgency or Frequency, No Flank pain.  Musculoskeletal: No Joint Pain or Swelling, No Decreased Range of Motion, No Back Pain.  Neurologic:  +Syncope, No Seizures, Muscle Weakness, Paresthesia, Vision Disturbance or Loss, No Diplopia, No Vertigo, No Difficulty Walking  Skin: + Dry skin, +Rash on Neck and Arms, + Hair Loss Psych: No Change in Mood or Affect, No Depression or Anxiety, No Memory  loss, No Confusion, or Hallucinations   Past Medical History  Diagnosis Date  . Thyroid disease   . Grave's disease   . Ovarian cyst     Past Surgical History  Procedure Laterality Date  . Thyroid surgery    . Fracture surgery    . Dilation and curettage of uterus    . Dilation and curettage of uterus  05/10/2012    Procedure: DILATATION AND CURETTAGE;  Surgeon: Tilda BurrowJohn V Ferguson, MD;  Location: [facility];  Service: Gynecology;  Laterality: N/A;  suction  . Iud removal  05/10/2012    Procedure: INTRAUTERINE DEVICE (IUD) REMOVAL;  Surgeon: Tilda BurrowJohn V Ferguson, MD;  Location: [facility];  Service: Gynecology;;  . Ankle surgery Left     pin put in  . Dilation and curettage of uterus N/A 06/19/2013    Procedure: DILATATION AND SUCTION CURETTAGE;  Surgeon: Tilda BurrowJohn V Ferguson, MD;  Location: [facility];  Service: Gynecology;  Laterality: N/A;     Prior to Admission medications   Medication Sig Start Date End Date Taking? Authorizing Provider  doxycycline (VIBRAMYCIN) 50 MG capsule Take 2 capsules (100 mg total) by mouth 2 (two) times daily. 08/23/14   Lazaro ArmsLuther H Eure, MD  HYDROcodone-acetaminophen (NORCO/VICODIN) 5-325 MG per tablet Take 1 tablet by mouth every 6 (six) hours as needed for moderate pain. 08/23/14   Lazaro ArmsLuther H Eure, MD  ibuprofen (ADVIL,MOTRIN) 200 MG tablet Take 400 mg by mouth every 6 (six) hours as needed for moderate pain.    Historical Provider, MD  levothyroxine (SYNTHROID, LEVOTHROID) 300 MCG tablet Take 300 mcg  by mouth daily before breakfast.    Historical Provider, MD  medroxyPROGESTERone (DEPO-PROVERA) 150 MG/ML injection Inject 150 mg into the muscle every 3 (three) months.    Historical Provider, MD     Allergies  Allergen Reactions  . Aleve [Naproxen Sodium] Nausea And Vomiting    'makes headaches worse"  . Iodine Other (See Comments)    Causes blackouts     Social History:  reports that she has never smoked. She has never used smokeless tobacco. She reports that she drinks  alcohol. She reports that she does not use illicit drugs.     Family History  Problem Relation Age of Onset  . Anesthesia problems Neg Hx   . Hypotension Neg Hx   . Malignant hyperthermia Neg Hx   . Pseudochol deficiency Neg Hx   . Diabetes Mother   . Hypertension Mother   . Hyperlipidemia Mother   . Cancer Mother   . Hypertension Father   . Hyperlipidemia Father   . Cancer Maternal Grandmother     uterine   . Heart disease Maternal Grandfather       Physical Exam:  GEN:  Pleasant Obese  28 y.o. Caucasian female examined and in no acute distress; cooperative with exam Filed Vitals:   09/23/14 0016  BP: 141/109  Pulse: 98  Temp: 99.3 F (37.4 C)  Resp: 20  Height: 5\' 7"  (1.702 m)  Weight: 95.255 kg (210 lb)  SpO2: 100%   Blood pressure 141/109, pulse 98, temperature 99.3 F (37.4 C), resp. rate 20, height 5\' 7"  (1.702 m), weight 95.255 kg (210 lb), last menstrual period 09/02/2014, SpO2 100.00%. PSYCH: She is alert and oriented x4; does not appear anxious does not appear depressed; affect is normal HEENT: Normocephalic and Atraumatic, Mucous membranes pink; PERRLA; EOM intact; Fundi:  Benign;  No scleral icterus, Nares: Patent, Oropharynx: Clear, Fair Dentition,    Neck:  FROM, No Cervical Lymphadenopathy nor Thyromegaly or Carotid Bruit; No JVD; Breasts:: Not examined CHEST WALL: No tenderness CHEST: Normal respiration, clear to auscultation bilaterally HEART: Regular rate and rhythm; no murmurs rubs or gallops BACK: No kyphosis or scoliosis; No CVA tenderness ABDOMEN: Positive Bowel Sounds, Obese, Soft Non-Tender; No Masses, No Organomegaly, No Pannus; No Intertriginous candida. Rectal Exam: Not done EXTREMITIES: No Cyanosis, Clubbing, or Edema; No Ulcerations. Genitalia: not examined PULSES: 2+ and symmetric SKIN: Normal hydration no rash or ulceration CNS:  Alert and Oriented x 4, No Focal Deficits Vascular: pulses palpable throughout    Labs on Admission:    Basic Metabolic Panel:  Recent Labs Lab 09/23/14 0110  NA 140  K 3.4*  CL 103  CO2 20  GLUCOSE 98  BUN 11  CREATININE 1.20*  CALCIUM 9.7  MG 2.1  PHOS 4.0   Liver Function Tests: No results found for this basename: AST, ALT, ALKPHOS, BILITOT, PROT, ALBUMIN,  in the last 168 hours No results found for this basename: LIPASE, AMYLASE,  in the last 168 hours No results found for this basename: AMMONIA,  in the last 168 hours CBC:  Recent Labs Lab 09/23/14 0110  WBC 9.5  NEUTROABS 4.8  HGB 14.4  HCT 41.5  MCV 82.5  PLT 222   Cardiac Enzymes:  Recent Labs Lab 09/23/14 0110  TROPONINI <0.30    BNP (last 3 results) No results found for this basename: PROBNP,  in the last 8760 hours CBG: No results found for this basename: GLUCAP,  in the last 168 hours  Radiological Exams on  Admission: Dg Chest Port 1 View  09/23/2014   CLINICAL DATA:  Mid 2 left-sided chest pain with exertion. Syncopal episode. Initial encounter.  EXAM: PORTABLE CHEST - 1 VIEW  COMPARISON:  None.  FINDINGS: Normal cardiac silhouette and mediastinal contours. Veiling opacities overlying the bilateral lower lungs are favored to represent overlying breast tissues. No discrete focal airspace opacities. No pleural effusion or pneumothorax. No evidence of edema. No acute osseus abnormalities.  IMPRESSION: No acute cardiopulmonary disease.   Electronically Signed   By: Simonne ComeJohn  Watts M.D.   On: 09/23/2014 01:28     EKG: Independently reviewed. Normal Sinus Rhythm with nonspecific changes in the  Infero-Lateral leads   Assessment/Plan:   28 y.o. female with   Principal Problem:  1.   Hypothyroidism   TSH and Free T4 Levels sent   IV Levothyroxine 50 mcg initiated x1   Endocrinology consultation requested if Available   Active Problems:   2.  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  Hypokalemia   Replete K+    4.  Chest pain   Telemetry Monitoring   Cycle Troponins        5.  DVT Prophylaxis   Lovenox     Code Status:   FULL CODE Family Communication:    Significant Other Present Disposition Plan:     Inpatient    Time spent: 5360 Minutes  Ron ParkerJENKINS,Kota Ciancio C [facility] Pager [phone]   If 7AM -7PM Please Contact the Day Rounding Team MD for [facility]  If 7PM-7AM, Please Contact Night-Floor Coverage  [email or web address] Password [email or web address] 09/23/2014, 6:00 AM

## 2014-09-23 NOTE — ED Provider Notes (Addendum)
CSN: [id number]     Arrival date & time 09/23/14  0003 History  This chart was scribed for Derwood KaplanAnkit Nitara Szczerba, MD by Tonye RoyaltyJoshua Chen, ED Scribe. This patient was seen in room A340/A340-01 and the patient's care was started at 12:50 AM.    Chief Complaint  Patient presents with  . Weakness   The history is provided by the patient. No language interpreter was used.    HPI Comments: Carolyn [name] is a 28 y.o. female who presents to the Emergency Department complaining of many problems since stopping her thyroid medication 3 months ago. She states her thyroid was removed in May 2011. She state she has not had her thyroid medication in 3 months after losing her insurance. She states she was on 300ug Synthroid once a day. She states symptoms have worsened in the past month. She states that her heart beats fast, sometimes skipping beats, with exertion; she states she also experience syncope with exertion but also sometimes experiences syncope while at rest. She reports approximately 10 episodes this week and states they last a few seconds. She states that she sometimes can tell when she is going to pass out but sometimes cannot. She reports bradycardia sometimes at rest. She also reports fatigue and weight gain. She reports diarrhea in the past few days right after eating or drinking with decreased appetite. She reports rash to her chest, knees, and arms. She reports joint pain worse on her left side. She denies tick bites. She denies medical problems besides thyroid problem. She denies history of alcohol abuse or liver disease. She denies dysuria or hematuria.  Past Medical History  Diagnosis Date  . Thyroid disease   . Grave's disease   . Ovarian cyst    Past Surgical History  Procedure Laterality Date  . Thyroid surgery    . Fracture surgery    . Dilation and curettage of uterus    . Dilation and curettage of uterus  05/10/2012    Procedure: DILATATION AND CURETTAGE;  Surgeon: Tilda BurrowJohn V Ferguson, MD;   Location: [facility];  Service: Gynecology;  Laterality: N/A;  suction  . Iud removal  05/10/2012    Procedure: INTRAUTERINE DEVICE (IUD) REMOVAL;  Surgeon: Tilda BurrowJohn V Ferguson, MD;  Location: [facility];  Service: Gynecology;;  . Ankle surgery Left     pin put in  . Dilation and curettage of uterus N/A 06/19/2013    Procedure: DILATATION AND SUCTION CURETTAGE;  Surgeon: Tilda BurrowJohn V Ferguson, MD;  Location: [facility];  Service: Gynecology;  Laterality: N/A;   Family History  Problem Relation Age of Onset  . Anesthesia problems Neg Hx   . Hypotension Neg Hx   . Malignant hyperthermia Neg Hx   . Pseudochol deficiency Neg Hx   . Diabetes Mother   . Hypertension Mother   . Hyperlipidemia Mother   . Cancer Mother   . Hypertension Father   . Hyperlipidemia Father   . Cancer Maternal Grandmother     uterine   . Heart disease Maternal Grandfather    History  Substance Use Topics  . Smoking status: Never Smoker   . Smokeless tobacco: Never Used  . Alcohol Use: Yes     Comment: social   OB History   Grav Para Term Preterm Abortions TAB SAB Ect Mult Living   2 1 1  1  1   1      Review of Systems  Constitutional: Positive for appetite change, fatigue and unexpected weight change.  Cardiovascular: Positive  for palpitations.       Tachycardia, bracycardia  Gastrointestinal: Positive for diarrhea.  Genitourinary: Negative for dysuria.  Musculoskeletal: Positive for arthralgias.  Skin: Positive for rash.  Neurological: Positive for syncope.  All other systems reviewed and are negative.     Allergies  Aleve and Iodine  Home Medications   Prior to Admission medications   Medication Sig Start Date End Date Taking? Authorizing Provider  doxycycline (VIBRAMYCIN) 50 MG capsule Take 2 capsules (100 mg total) by mouth 2 (two) times daily. 08/23/14   Lazaro ArmsLuther H Eure, MD  HYDROcodone-acetaminophen (NORCO/VICODIN) 5-325 MG per tablet Take 1 tablet by mouth every 6 (six) hours as needed for moderate pain.  08/23/14   Lazaro ArmsLuther H Eure, MD  ibuprofen (ADVIL,MOTRIN) 200 MG tablet Take 400 mg by mouth every 6 (six) hours as needed for moderate pain.    Historical Provider, MD  levothyroxine (SYNTHROID, LEVOTHROID) 300 MCG tablet Take 300 mcg by mouth daily before breakfast.    Historical Provider, MD  medroxyPROGESTERone (DEPO-PROVERA) 150 MG/ML injection Inject 150 mg into the muscle every 3 (three) months.    Historical Provider, MD   BP 126/87  Pulse 63  Temp(Src) 97.8 F (36.6 C) (Oral)  Resp 18  Ht 5\' 7"  (1.702 m)  Wt 209 lb 3.5 oz (94.9 kg)  BMI 32.76 kg/m2  SpO2 98%  LMP 09/02/2014 Physical Exam  Nursing note and vitals reviewed. Constitutional: She is oriented to person, place, and time. She appears well-developed and well-nourished.  HENT:  Head: Normocephalic and atraumatic.  Tongue normal, moist mucous membranes  Eyes: Conjunctivae are normal.  Neck: Normal range of motion. Neck supple.  Cardiovascular: Normal rate, regular rhythm and normal heart sounds.   No murmur heard. Pulmonary/Chest: Effort normal.  Abdominal: Soft. Bowel sounds are normal. She exhibits no distension. There is no tenderness. There is no rebound and no guarding.  Musculoskeletal: Normal range of motion. She exhibits no edema.  Neurological: She is alert and oriented to person, place, and time.  Skin: Skin is warm and dry.  Psychiatric: She has a normal mood and affect.    ED Course  Procedures (including critical care time) Labs Review Labs Reviewed  CBC WITH DIFFERENTIAL - Abnormal; Notable for the following:    Lymphs Abs 4.1 (*)    All other components within normal limits  BASIC METABOLIC PANEL - Abnormal; Notable for the following:    Potassium 3.4 (*)    Creatinine, Ser 1.20 (*)    GFR calc non Af Amer 61 (*)    GFR calc Af Amer 71 (*)    Anion gap 17 (*)    All other components within normal limits  URINALYSIS, ROUTINE W REFLEX MICROSCOPIC - Abnormal; Notable for the following:     Leukocytes, UA TRACE (*)    All other components within normal limits  URINE MICROSCOPIC-ADD ON - Abnormal; Notable for the following:    Squamous Epithelial / LPF FEW (*)    Bacteria, UA FEW (*)    All other components within normal limits  TROPONIN I  MAGNESIUM  PHOSPHORUS  TROPONIN I  TSH  T4, FREE  CORTISOL  T4, FREE  URINE RAPID DRUG SCREEN (HOSP PERFORMED)  TROPONIN I  TROPONIN I  POC URINE PREG, ED    Imaging Review Dg Chest Port 1 View  09/23/2014   CLINICAL DATA:  Mid 2 left-sided chest pain with exertion. Syncopal episode. Initial encounter.  EXAM: PORTABLE CHEST - 1 VIEW  COMPARISON:  None.  FINDINGS: Normal cardiac silhouette and mediastinal contours. Veiling opacities overlying the bilateral lower lungs are favored to represent overlying breast tissues. No discrete focal airspace opacities. No pleural effusion or pneumothorax. No evidence of edema. No acute osseus abnormalities.  IMPRESSION: No acute cardiopulmonary disease.   Electronically Signed   By: Simonne Come M.D.   On: 09/23/2014 01:28     EKG Interpretation   Date/Time:  Monday September 23 2014 00:40:50 EDT Ventricular Rate:  92 PR Interval:  153 QRS Duration: 83 QT Interval:  360 QTC Calculation: 445 R Axis:   102 Text Interpretation:  Sinus rhythm Borderline right axis deviation Low  voltage, precordial leads Nonspecific T abnormalities, diffuse leads No  old ekg to compare Confirmed by Ariq Khamis, MD, Janey Genta [phone]) on 09/23/2014  12:58:11 AM     DIAGNOSTIC STUDIES: Oxygen Saturation is 100% on room air, normal by my interpretation.    COORDINATION OF CARE: 12:56 AM Discussed treatment plan with patient at beside, the patient agrees with the plan and has no further questions at this time.   MDM   Final diagnoses:  Syncope and collapse  Postoperative hypothyroidism    I personally performed the services described in this documentation, which was scribed in my presence. The recorded  information has been reviewed and is accurate.  Pt comes in with weakness, syncope, palpitations, malaise, poor appetite. Hx of hypothyroidism, with no meds taken for 3 months, as she didn't have insurance and cash to see PCP.  EKG shows low voltage, but the CXR doesn't indicate large pericardial effusion. HR is stable now, although, pt reports that HR has been high and low.  Syncope x 10 concerning, as myxedema coma can present with that. PT has no AMS right now. Spoke with Dr. Lovell Sheehan, hospitalist, and it was decided that we best admit pt as obs,  Start oral levothyroxine, and not iv T4.    Derwood Kaplan, MD 09/26/14 1423  Derwood Kaplan, MD 09/26/14 1423

## 2014-09-23 NOTE — Progress Notes (Signed)
Pt requested all four rails up in the bed, pt states it makes her feel safer.  Call bell and phone in reach.  Will continue to monitor.

## 2014-09-23 NOTE — ED Notes (Signed)
Pt states she has not had her thyroid medication in 3 months. Pt states when she gets up and walks, her heart beats so heavy that her chest hurts. Pt then states when she sits down her heart rate drops to the 30's and she "blacks out." Pt also states that when she eats, she has diarrhea within 20 mins and poops everything in her system out. Pt states she feels like she is getting ready to go into a coma.

## 2014-09-23 NOTE — ED Notes (Signed)
Pt's pulse dropped to 64 when she got back in the bed and pt c/o her chest hurting; EDP made aware.

## 2014-09-24 LAB — CBC
HEMATOCRIT: 36.6 % (ref 36.0–46.0)
HEMOGLOBIN: 12.4 g/dL (ref 12.0–15.0)
MCH: 28.5 pg (ref 26.0–34.0)
MCHC: 33.9 g/dL (ref 30.0–36.0)
MCV: 84.1 fL (ref 78.0–100.0)
Platelets: 197 10*3/uL (ref 150–400)
RBC: 4.35 MIL/uL (ref 3.87–5.11)
RDW: 14.1 % (ref 11.5–15.5)
WBC: 9.7 10*3/uL (ref 4.0–10.5)

## 2014-09-24 LAB — BASIC METABOLIC PANEL
ANION GAP: 13 (ref 5–15)
BUN: 9 mg/dL (ref 6–23)
CO2: 22 meq/L (ref 19–32)
Calcium: 8.6 mg/dL (ref 8.4–10.5)
Chloride: 106 mEq/L (ref 96–112)
Creatinine, Ser: 1.09 mg/dL (ref 0.50–1.10)
GFR calc non Af Amer: 69 mL/min — ABNORMAL LOW (ref >90)
GFR, EST AFRICAN AMERICAN: 80 mL/min — AB (ref >90)
Glucose, Bld: 90 mg/dL (ref 70–99)
POTASSIUM: 4.2 meq/L (ref 3.7–5.3)
Sodium: 141 mEq/L (ref 137–147)

## 2014-09-24 LAB — THYROID PEROXIDASE ANTIBODY: THYROID PEROXIDASE ANTIBODY: 52 [IU]/mL — AB (ref <9)

## 2014-09-24 LAB — THYROGLOBULIN ANTIBODY: Thyroglobulin Ab: <1 IU/mL (ref <2)

## 2014-09-24 LAB — T3, FREE: T3, Free: 0.8 pg/mL — ABNORMAL LOW (ref 2.3–4.2)

## 2014-09-24 MED ORDER — OXYCODONE HCL 5 MG PO TABS: 5.0000 mg | ORAL_TABLET | Freq: Four times a day (QID) | ORAL | Status: DC | PRN

## 2014-09-24 MED ORDER — LEVOTHYROXINE SODIUM 150 MCG PO TABS: 150.0000 ug | ORAL_TABLET | Freq: Every day | ORAL | Status: DC

## 2014-09-24 NOTE — Progress Notes (Signed)
Discharged home with instructions given on medications,and follow up visits,patient verbalized understanding. Prescriptions sent with patient. Accompanied by staff to an awaiting vehicle. 

## 2014-09-24 NOTE — Care Management Note (Signed)
    Page 1 of 1   09/24/2014     5:03:35 PM CARE MANAGEMENT NOTE 09/24/2014  Patient:  [name],Tomesha R   Account Number:  [id number]  Date Initiated:  09/24/2014  Documentation initiated by:  Anibal HendersonBOLDEN,Leiam Hopwood  Subjective/Objective Assessment:   Admitted with hypothyroidism, due to not taking medications. Pt did not have insurance, and could not afford meds or MD visits, but now has medicaid.     Action/Plan:   No needs identified   Anticipated DC Date:  09/24/2014   Anticipated DC Plan:  HOME/SELF CARE      DC Planning Services  CM consult      Choice offered to / List presented to:             Status of service:  Completed, signed off Medicare Important Message given?   (If response is "NO", the following Medicare IM given date fields will be blank) Date Medicare IM given:   Medicare IM given by:   Date Additional Medicare IM given:   Additional Medicare IM given by:    Discharge Disposition:  HOME/SELF CARE  Per UR Regulation:  Reviewed for med. necessity/level of care/duration of stay  If discussed at Long Length of Stay Meetings, dates discussed:    Comments:  09/24/14 1700 Anibal HendersonGeneva Santita Hunsberger RN/CM

## 2014-09-24 NOTE — Progress Notes (Signed)
Subjective: F/U for hypothyroidism.  Objective: Pt feels better, still c/o fatigue.    Vital signs in last 24 hours:  Filed Vitals:   09/23/14 1131 09/23/14 1635 09/23/14 2110 09/24/14 0556  BP:  132/90 122/86   Pulse: 84 67 69 68  Temp: 99.2 F (37.3 C) 98.3 F (36.8 C) 99.1 F (37.3 C) 98.4 F (36.9 C)  TempSrc: Oral Oral Oral Oral  Resp: 20 20 20 20   Height:      Weight:      SpO2:  98% 98%     Intake/Output Summary (Last 24 hours) at 09/24/14 0807 Last data filed at 09/23/14 1836  Gross per 24 hour  Intake 938.75 ml  Output      0 ml  Net 938.75 ml   Weight change:   Gen: resting in her hospital bed, no acute distress.  HEENT: no exophthalmost, no lid lag.  Neck: post thyroidectomy scar  Chest: CTAB  CVS: RRR, no M/[name]/G  Abd: Soft, NT  Ext: no edema, no deformity  CNS: non focal  Skin: no rash , no hyperemia.    Lab Results:  Basic Metabolic Panel:  Recent Labs  13/07/6509/12/15 0110 09/24/14 0546  NA 140 141  K 3.4* 4.2  CL 103 106  CO2 20 22  GLUCOSE 98 90  BUN 11 9  CREATININE 1.20* 1.09  CALCIUM 9.7 8.6  MG 2.1  --   PHOS 4.0  --     Liver Function Tests:  No results found for this basename: AST, ALT, ALKPHOS, BILITOT, PROT, ALBUMIN,  in the last 72 hours No results found for this basename: LIPASE, AMYLASE,  in the last 72 hours  CBC:  Recent Labs  09/23/14 0110 09/24/14 0546  WBC 9.5 9.7  NEUTROABS 4.8  --   HGB 14.4 12.4  HCT 41.5 36.6  MCV 82.5 84.1  PLT 222 197    Cardiac Enzymes:  Recent Labs  09/23/14 0110 09/23/14 0605 09/23/14 1026  TROPONINI <0.30 <0.30 <0.30    Studies/Results: Dg Chest Port 1 View  09/23/2014   CLINICAL DATA:  Mid 2 left-sided chest pain with exertion. Syncopal episode. Initial encounter.  EXAM: PORTABLE CHEST - 1 VIEW  COMPARISON:  None.  FINDINGS: Normal cardiac silhouette and mediastinal contours. Veiling opacities overlying the bilateral lower lungs are favored to represent overlying breast  tissues. No discrete focal airspace opacities. No pleural effusion or pneumothorax. No evidence of edema. No acute osseus abnormalities.  IMPRESSION: No acute cardiopulmonary disease.   Electronically Signed   By: Simonne ComeJohn  Watts M.D.   On: 09/23/2014 01:28     Medications:  Scheduled Meds: . enoxaparin (LOVENOX) injection  40 mg Subcutaneous Q24H  . levothyroxine  150 mcg Oral QAC breakfast  . sodium chloride  3 mL Intravenous Q12H   Continuous Infusions: . sodium chloride 75 mL/hr at 09/24/14 0019   PRN Meds:.acetaminophen, acetaminophen, alum & mag hydroxide-simeth, HYDROmorphone (DILAUDID) injection, ondansetron (ZOFRAN) IV, ondansetron, oxyCODONE    Results for [name], Carolyn [name] (MRN [id number]) as of 09/24/2014 09:15  Ref. Range 09/23/2014 01:10 09/23/2014 03:55  TSH Latest Range: 0.350-4.500 uIU/mL >100.000 (H)   Free T4 Latest Range: 0.80-1.80 ng/dL 2.840.24 (L)   T3, Free Latest Range: 2.3-4.2 pg/mL  0.8 (L)  Thyroglobulin Ab Latest Range: <2 IU/mL  <1   Results for [name], Carolyn [name] (MRN [id number]) as of 09/24/2014 09:15  Ref. Range 09/23/2014 03:55  Thyroid Peroxidase Antibody Latest Range: <9 IU/mL 52 (H)  Assessment/Plan: Principal Problem:   Hypothyroidism Active Problems:   Syncope   Hypokalemia   Chest pain   Acute kidney injury   Diarrhea    Discussion:   Per her history, she has postsurgical hypothyroidism for Graves disease. Surgical pathology not available to review at this time. I will try to obtain records from Dr. Ollen GrossPaul's office as out patient.   Her TSH and free t4 are profoundly abnormal pointing severe hypothyroidism TSH >100  ( normal 0.35-4.5), free T4 low at 0.24 ( 0.8-1.8). Pt has been symptomatic of hypothyroidism.  Continue Levothyroxine 150 mcg po qam. It will take 7-10 days For her thyroid hormones to reach steady state, which can be followed as out patient. If d/c is planned , she can be discharged on same dose of LT4 150mcg po qam.  Her  cortisol is adequate at 10.9.  Pt reports that she will like to follow with me in [place] than in [place], and I l requested for arrangement for out patient f/u in 2 weeks. I will sign off, thank you for consult.   LOS: 1 day    GEBRESELASSIE NIDA, MD 09/24/2014, 8:07 AM

## 2014-09-24 NOTE — Discharge Summary (Signed)
Physician Discharge Summary  [name]:[id number] DOB: 03/05/1986 DOA: 09/23/2014  PCP: Alleen BorneLAGGETT,ELIN, PA-C  Admit date: 09/23/2014 Discharge date: 09/24/2014  Time spent: 40 minutes  Recommendations for Outpatient Follow-up:  1. Dr. Fransico HimNida 10/09/14 for follow up to hypothyroidism.  2. PCP 2-3 weeks for general evaluation and re-establishment of relationship  Discharge Diagnoses:  Principal Problem:   Hypothyroidism Active Problems:   Syncope   Hypokalemia   Chest pain   Acute kidney injury   Diarrhea   Discharge Condition: stable  Diet recommendation: regular  Filed Weights   09/23/14 0016 09/23/14 0659  Weight: 95.255 kg (210 lb) 94.9 kg (209 lb 3.5 oz)    History of present illness:  Carolyn [name] is a 28 y.o. female with a history of Hypothyroid S/P thyroidectomy in 2011 who presented to the ED on 09/23/14 with complaints of multiple episodes of passing out for the previous 3 weeks, along with episodes of chest pain and palpitations. She reported that she had been out of her medication for the 3 months and did not have insurance and was not able to see her doctor or get her medications refilled despite trying. She reported that she passed out 6 times the week prior to presentation. She reported that she had also had fatigue and weakness and hair loss and cold intolerance and weight gain over the previous 2 months.  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 TSH >100, free T4 0.24. Cortisol level 10.9.  Somewhat orthostatic from sitting to standing on admission. Troponin neg x3. Provided with synthroid and IV fluids. orthostatic hypotension resolved at discharge.    2. Hypothyroidism: s/p thyroidectomy. reports no synthroid for 3 months due to finances. TSH >100 and free T4 0.24. Evaluated by Dr Fransico HimNida who opined she has postsurgical hypothyroidism or Graves disease. He also  noted it will take 7-10 days to reach steady state and can be followed OP. Appointment scheduled 10/09/14.    3. Chest pain: resolved. Troponin negative x3. No events on tele   4. Hypokalemia: reports diarrhea prior to admission. Resolved at discharge.   Procedures:  None   Consultations:  Dr Fransico HimNida endocrinology  Discharge Exam: Filed Vitals:   09/24/14 0950  BP: 146/84  Pulse: 66  Temp: 98.2 F (36.8 C)  Resp: 20    General: well nourished appears comfortable Cardiovascular: RRR No MGR No LE edema PPP Respiratory: normal effort BS clear bilaterally no wheeze   Discharge Instructions You were cared for by a hospitalist during your [name] stay. If you have any questions about your discharge medications or the care you received while you were in the [name] after you are discharged, you can call the unit and asked to speak with the hospitalist on call if the hospitalist that took care of you is not available. Once you are discharged, your primary care physician will handle any further medical issues. Please note that NO REFILLS for any discharge medications will be authorized once you are discharged, as it is imperative that you return to your primary care physician (or establish a relationship with a primary care physician if you do not have one) for your aftercare needs so that they can reassess your need for medications and monitor your lab values.   Current Discharge Medication List    START taking these medications   Details  oxyCODONE (OXY IR/ROXICODONE) 5 MG immediate release tablet Take 1 tablet (5  mg total) by mouth every 6 (six) hours as needed for moderate pain. Qty: 10 tablet, Refills: 0      CONTINUE these medications which have CHANGED   Details  levothyroxine (SYNTHROID, LEVOTHROID) 150 MCG tablet Take 1 tablet (150 mcg total) by mouth daily before breakfast. Qty: 30 tablet, Refills: 0      CONTINUE these medications which have NOT CHANGED   Details   Multiple Vitamins-Minerals (MULTIVITAMIN WITH MINERALS) tablet Take 1 tablet by mouth daily.    ibuprofen (ADVIL,MOTRIN) 200 MG tablet Take 400 mg by mouth every 6 (six) hours as needed for moderate pain.    medroxyPROGESTERone (DEPO-PROVERA) 150 MG/ML injection Inject 150 mg into the muscle every 3 (three) months.       Allergies  Allergen Reactions  . Aleve [Naproxen Sodium] Nausea And Vomiting    'makes headaches worse"  . Iodine Other (See Comments)    Causes blackouts   Follow-up Information   Follow up with NIDA,GEBRESELASSIE, MD.   Specialty:  Endocrinology   Contact information:   [phone] [place] [id number] [phone]        The results of significant diagnostics from this hospitalization (including imaging, microbiology, ancillary and laboratory) are listed below for reference.    Significant Diagnostic Studies: Dg Chest Port 1 View  09/23/2014   CLINICAL DATA:  Mid 2 left-sided chest pain with exertion. Syncopal episode. Initial encounter.  EXAM: PORTABLE CHEST - 1 VIEW  COMPARISON:  None.  FINDINGS: Normal cardiac silhouette and mediastinal contours. Veiling opacities overlying the bilateral lower lungs are favored to represent overlying breast tissues. No discrete focal airspace opacities. No pleural effusion or pneumothorax. No evidence of edema. No acute osseus abnormalities.  IMPRESSION: No acute cardiopulmonary disease.   Electronically Signed   By: Simonne ComeJohn  Watts M.D.   On: 09/23/2014 01:28    Microbiology: No results found for this or any previous visit (from the past 240 hour(s)).   Labs: Basic Metabolic Panel:  Recent Labs Lab 09/23/14 0110 09/24/14 0546  NA 140 141  K 3.4* 4.2  CL 103 106  CO2 20 22  GLUCOSE 98 90  BUN 11 9  CREATININE 1.20* 1.09  CALCIUM 9.7 8.6  MG 2.1  --   PHOS 4.0  --    Liver Function Tests: No results found for this basename: AST, ALT, ALKPHOS, BILITOT, PROT, ALBUMIN,  in the last 168 hours No results  found for this basename: LIPASE, AMYLASE,  in the last 168 hours No results found for this basename: AMMONIA,  in the last 168 hours CBC:  Recent Labs Lab 09/23/14 0110 09/24/14 0546  WBC 9.5 9.7  NEUTROABS 4.8  --   HGB 14.4 12.4  HCT 41.5 36.6  MCV 82.5 84.1  PLT 222 197   Cardiac Enzymes:  Recent Labs Lab 09/23/14 0110 09/23/14 0605 09/23/14 1026  TROPONINI <0.30 <0.30 <0.30   BNP: BNP (last 3 results) No results found for this basename: PROBNP,  in the last 8760 hours CBG: No results found for this basename: GLUCAP,  in the last 168 hours     Signed:  Gwenyth BenderBLACK,Gaddiel Cullens M  [facility] 09/24/2014, 10:33 AM

## 2014-09-24 NOTE — Progress Notes (Signed)
Patient ambulated in hallway times 2 assist,c/o chest tightness and dizziness while ambulating,wheelchair provided for patient,and patient was escorted back to room,B/P133/99,heart rate 74,oxygen saturation 100%,Dr Ardyth HarpsHernandez notified,no orders received at this time.Will continue to monitor patient.

## 2014-09-24 NOTE — Care Management Utilization Note (Signed)
UR completed 

## 2014-09-24 NOTE — Discharge Summary (Signed)
Patient seen and examined. Agree with discharge home today. Patient presents with severe hypothyroidism with a TSH greater than 100 and a free T4 of 0.24. Has been seen by endocrinology. Has been started on Synthroid 150 mcg daily. Will need repeat thyroid function tests in 4-6 weeks and dose adjustment as deemed necessary.  Peggye PittEstela Hernandez, MD [facility] Pager: [phone]

## 2014-09-25 LAB — GI PATHOGEN PANEL BY PCR, STOOL
C DIFFICILE TOXIN A/B: NEGATIVE
Campylobacter by PCR: NEGATIVE
Cryptosporidium by PCR: NEGATIVE
E COLI (STEC): NEGATIVE
E coli (ETEC) LT/ST: NEGATIVE
E coli 0157 by PCR: NEGATIVE
G LAMBLIA BY PCR: NEGATIVE
Norovirus GI/GII: NEGATIVE
Rotavirus A by PCR: NEGATIVE
SALMONELLA BY PCR: NEGATIVE
Shigella by PCR: NEGATIVE

## 2014-10-10 ENCOUNTER — Emergency Department (HOSPITAL_COMMUNITY): Payer: Medicaid Other

## 2014-10-10 ENCOUNTER — Emergency Department (HOSPITAL_COMMUNITY)
Admission: EM | Admit: 2014-10-10 | Discharge: 2014-10-11 | Disposition: A | Discharge status: Home / Self Care | Payer: Medicaid Other | Attending: Emergency Medicine | Admitting: Emergency Medicine

## 2014-10-10 ENCOUNTER — Encounter (HOSPITAL_COMMUNITY): Payer: Self-pay | Admitting: Emergency Medicine

## 2014-10-10 DIAGNOSIS — Z8742 Personal history of other diseases of the female genital tract: Secondary | ICD-10-CM | POA: Diagnosis not present

## 2014-10-10 DIAGNOSIS — Z79899 Other long term (current) drug therapy: Secondary | ICD-10-CM | POA: Insufficient documentation

## 2014-10-10 DIAGNOSIS — M545 Low back pain: Secondary | ICD-10-CM | POA: Diagnosis not present

## 2014-10-10 DIAGNOSIS — E079 Disorder of thyroid, unspecified: Secondary | ICD-10-CM | POA: Diagnosis not present

## 2014-10-10 DIAGNOSIS — R079 Chest pain, unspecified: Secondary | ICD-10-CM | POA: Diagnosis present

## 2014-10-10 DIAGNOSIS — R52 Pain, unspecified: Secondary | ICD-10-CM

## 2014-10-10 LAB — CBC WITH DIFFERENTIAL/PLATELET
BASOS ABS: 0.1 10*3/uL (ref 0.0–0.1)
Basophils Relative: 1 % (ref 0–1)
EOS PCT: 2 % (ref 0–5)
Eosinophils Absolute: 0.1 10*3/uL (ref 0.0–0.7)
HCT: 38 % (ref 36.0–46.0)
Hemoglobin: 13.1 g/dL (ref 12.0–15.0)
LYMPHS ABS: 3.6 10*3/uL (ref 0.7–4.0)
LYMPHS PCT: 44 % (ref 12–46)
MCH: 29.1 pg (ref 26.0–34.0)
MCHC: 34.5 g/dL (ref 30.0–36.0)
MCV: 84.4 fL (ref 78.0–100.0)
Monocytes Absolute: 0.6 10*3/uL (ref 0.1–1.0)
Monocytes Relative: 7 % (ref 3–12)
NEUTROS ABS: 3.7 10*3/uL (ref 1.7–7.7)
Neutrophils Relative %: 46 % (ref 43–77)
Platelets: 180 10*3/uL (ref 150–400)
RBC: 4.5 MIL/uL (ref 3.87–5.11)
RDW: 14.6 % (ref 11.5–15.5)
WBC: 8 10*3/uL (ref 4.0–10.5)

## 2014-10-10 LAB — BASIC METABOLIC PANEL
Anion gap: 16 — ABNORMAL HIGH (ref 5–15)
BUN: 10 mg/dL (ref 6–23)
CALCIUM: 9.8 mg/dL (ref 8.4–10.5)
CHLORIDE: 105 meq/L (ref 96–112)
CO2: 20 meq/L (ref 19–32)
Creatinine, Ser: 0.99 mg/dL (ref 0.50–1.10)
GFR calc Af Amer: 90 mL/min — ABNORMAL LOW (ref >90)
GFR calc non Af Amer: 77 mL/min — ABNORMAL LOW (ref >90)
GLUCOSE: 93 mg/dL (ref 70–99)
Potassium: 3.8 mEq/L (ref 3.7–5.3)
SODIUM: 141 meq/L (ref 137–147)

## 2014-10-10 LAB — URINALYSIS, ROUTINE W REFLEX MICROSCOPIC
Bilirubin Urine: NEGATIVE
Glucose, UA: NEGATIVE mg/dL
Hgb urine dipstick: NEGATIVE
Ketones, ur: NEGATIVE mg/dL
Leukocytes, UA: NEGATIVE
NITRITE: NEGATIVE
Protein, ur: NEGATIVE mg/dL
SPECIFIC GRAVITY, URINE: 1.025 (ref 1.005–1.030)
Urobilinogen, UA: 0.2 mg/dL (ref 0.0–1.0)
pH: 6 (ref 5.0–8.0)

## 2014-10-10 LAB — TROPONIN I

## 2014-10-10 MED ORDER — LORAZEPAM 2 MG/ML IJ SOLN
0.5000 mg | Freq: Once | INTRAMUSCULAR | Status: AC
  Administered 2014-10-10: 0.5 mg via INTRAVENOUS
  Filled 2014-10-10: qty 1

## 2014-10-10 MED ORDER — HYDROMORPHONE HCL 1 MG/ML IJ SOLN
1.0000 mg | Freq: Once | INTRAMUSCULAR | Status: AC
  Administered 2014-10-10: 1 mg via INTRAVENOUS
  Filled 2014-10-10: qty 1

## 2014-10-10 MED ORDER — OXYCODONE-ACETAMINOPHEN 5-325 MG PO TABS
1.0000 | ORAL_TABLET | Freq: Four times a day (QID) | ORAL | Status: DC | PRN
Start: 2014-10-10 — End: 2016-01-14

## 2014-10-10 MED ORDER — OXYCODONE-ACETAMINOPHEN 5-325 MG PO TABS: 1.0000 | ORAL_TABLET | Freq: Four times a day (QID) | ORAL | Status: DC | PRN

## 2014-10-10 NOTE — ED Provider Notes (Signed)
CSN: [id number]     Arrival date & time 10/10/14  1952 History  This chart was scribed for Benny LennertJoseph L Damiyah Ditmars, MD by Richarda Overlieichard Holland, ED Scribe. This patient was seen in room APA09/APA09 and the patient's care was started 10:25 PM.    Chief Complaint  Patient presents with  . Chest Pain   Patient is a 28 y.o. female presenting with chest pain.  Chest Pain Pain location:  R chest Pain radiates to the back: no   Pain severity:  Moderate Onset quality:  Gradual Worsened by:  Exertion Associated symptoms: back pain and shortness of breath   Associated symptoms: no abdominal pain, no cough, no fatigue and no headache    HPI Comments: Carolyn [name] is a 28 y.o. female who presents to the Emergency Department complaining of chest pain. She has had symptoms like these before and states she was admitted to the hospital 3 weeks ago for her hypothyroidism. Pt states her pain worsens on exertion. Pt complains of low back pain as well. Pt reports associated chest tightness, SOB and says she feels faint at times.   PCP CLAGGETT   Past Medical History  Diagnosis Date  . Thyroid disease   . Grave's disease   . Ovarian cyst   . Acute kidney injury    Past Surgical History  Procedure Laterality Date  . Thyroid surgery    . Fracture surgery    . Dilation and curettage of uterus    . Dilation and curettage of uterus  05/10/2012    Procedure: DILATATION AND CURETTAGE;  Surgeon: Tilda BurrowJohn V Ferguson, MD;  Location: [facility];  Service: Gynecology;  Laterality: N/A;  suction  . Iud removal  05/10/2012    Procedure: INTRAUTERINE DEVICE (IUD) REMOVAL;  Surgeon: Tilda BurrowJohn V Ferguson, MD;  Location: [facility];  Service: Gynecology;;  . Ankle surgery Left     pin put in  . Dilation and curettage of uterus N/A 06/19/2013    Procedure: DILATATION AND SUCTION CURETTAGE;  Surgeon: Tilda BurrowJohn V Ferguson, MD;  Location: [facility];  Service: Gynecology;  Laterality: N/A;   Family History  Problem Relation Age of Onset  . Anesthesia  problems Neg Hx   . Hypotension Neg Hx   . Malignant hyperthermia Neg Hx   . Pseudochol deficiency Neg Hx   . Diabetes Mother   . Hypertension Mother   . Hyperlipidemia Mother   . Cancer Mother   . Hypertension Father   . Hyperlipidemia Father   . Cancer Maternal Grandmother     uterine   . Heart disease Maternal Grandfather    History  Substance Use Topics  . Smoking status: Never Smoker   . Smokeless tobacco: Never Used  . Alcohol Use: Yes     Comment: social   OB History   Grav Para Term Preterm Abortions TAB SAB Ect Mult Living   2 1 1  1  1   1      Review of Systems  Constitutional: Negative for appetite change and fatigue.  HENT: Negative for congestion, ear discharge and sinus pressure.   Eyes: Negative for discharge.  Respiratory: Positive for chest tightness and shortness of breath. Negative for cough.   Cardiovascular: Positive for chest pain.  Gastrointestinal: Negative for abdominal pain and diarrhea.  Genitourinary: Negative for frequency and hematuria.  Musculoskeletal: Positive for back pain.  Skin: Negative for rash.  Neurological: Negative for seizures and headaches.  Psychiatric/Behavioral: Negative for hallucinations.      Allergies  Aleve and Iodine  Home Medications   Prior to Admission medications   Medication Sig Start Date End Date Taking? Authorizing Provider  ibuprofen (ADVIL,MOTRIN) 200 MG tablet Take 400 mg by mouth every 6 (six) hours as needed for moderate pain.    Historical Provider, MD  levothyroxine (SYNTHROID, LEVOTHROID) 150 MCG tablet Take 1 tablet (150 mcg total) by mouth daily before breakfast. 09/24/14   Gwenyth Bender, NP  medroxyPROGESTERone (DEPO-PROVERA) 150 MG/ML injection Inject 150 mg into the muscle every 3 (three) months.    Historical Provider, MD  Multiple Vitamins-Minerals (MULTIVITAMIN WITH MINERALS) tablet Take 1 tablet by mouth daily.    Historical Provider, MD  oxyCODONE (OXY IR/ROXICODONE) 5 MG immediate  release tablet Take 1 tablet (5 mg total) by mouth every 6 (six) hours as needed for moderate pain. 09/24/14   Gwenyth Bender, NP   BP 145/105  Pulse 69  Temp(Src) 98.9 F (37.2 C)  Resp 20  Ht 5\' 7"  (1.702 m)  Wt 210 lb (95.255 kg)  BMI 32.88 kg/m2  SpO2 100%  LMP 09/10/2014 Physical Exam  Nursing note and vitals reviewed. Constitutional: She is oriented to person, place, and time. She appears well-developed.  HENT:  Head: Normocephalic.  Eyes: Conjunctivae and EOM are normal. No scleral icterus.  Neck: Neck supple. No thyromegaly present.  Cardiovascular: Normal rate and regular rhythm.  Exam reveals no gallop and no friction rub.   No murmur heard. Pulmonary/Chest: No stridor. She has no wheezes. She has no rales. She exhibits no tenderness.  Tender anterior chest bilaterally   Abdominal: She exhibits no distension. There is no tenderness. There is no rebound.  Musculoskeletal: Normal range of motion. She exhibits no edema.  Tenderness along lumbar spine    Lymphadenopathy:    She has no cervical adenopathy.  Neurological: She is oriented to person, place, and time. She exhibits normal muscle tone. Coordination normal.  Skin: No rash noted. No erythema.  Psychiatric: She has a normal mood and affect. Her behavior is normal.    ED Course  Procedures  DIAGNOSTIC STUDIES: Oxygen Saturation is 100% on RA, normal by my interpretation.    COORDINATION OF CARE: 10:30 PM Discussed treatment plan with pt at bedside and pt agreed to plan.   Labs Review Labs Reviewed  BASIC METABOLIC PANEL - Abnormal; Notable for the following:    GFR calc non Af Amer 77 (*)    GFR calc Af Amer 90 (*)    Anion gap 16 (*)    All other components within normal limits  CBC WITH DIFFERENTIAL  TROPONIN I  URINALYSIS, ROUTINE W REFLEX MICROSCOPIC    Imaging Review Dg Chest 2 View  10/10/2014   CLINICAL DATA:  Chest tightness, shortness of breath  EXAM: CHEST  2 VIEW  COMPARISON:  09/23/2014   FINDINGS: The heart size and mediastinal contours are within normal limits. Both lungs are clear. The visualized skeletal structures are unremarkable.  IMPRESSION: No active cardiopulmonary disease.   Electronically Signed   By: Elige Ko   On: 10/10/2014 20:50     EKG Interpretation   Date/Time:  Thursday October 10 2014 20:07:05 EDT Ventricular Rate:  74 PR Interval:  148 QRS Duration: 80 QT Interval:  362 QTC Calculation: 401 R Axis:   89 Text Interpretation:  Normal sinus rhythm with sinus arrhythmia Low  voltage QRS T wave abnormality, consider inferior ischemia Abnormal ECG  Normal sinus rhythm T wave inversion in multiple leads Low voltage  QRS No  significant change since last tracing Abnormal ekg Confirmed by Gerhard MunchLOCKWOOD,  ROBERT  MD [phone]([id number]) on 10/10/2014 8:17:36 PM      MDM   Final diagnoses:  Pain         Benny LennertJoseph L Tasnim Balentine, MD 10/10/14 [id number]

## 2014-10-10 NOTE — ED Notes (Signed)
Chest "tightness"  Feels sob  And faint at times.  "T 4 was low and TSH high"  Low back pain

## 2014-10-10 NOTE — Discharge Instructions (Signed)
Contact dr. Randa Evensneida tomorrow

## 2014-10-11 LAB — T4, FREE: Free T4: 0.93 ng/dL (ref 0.80–1.80)

## 2014-10-11 LAB — TSH

## 2014-10-11 NOTE — ED Notes (Signed)
Pt alert & oriented x4, stable gait. Patient given discharge instructions, paperwork & prescription(s). Patient  instructed to stop at the registration desk to finish any additional paperwork. Patient verbalized understanding. Pt left department w/ no further questions. 

## 2014-10-14 ENCOUNTER — Encounter (HOSPITAL_COMMUNITY): Payer: Self-pay | Admitting: Emergency Medicine

## 2014-10-30 ENCOUNTER — Emergency Department (HOSPITAL_COMMUNITY)
Admission: EM | Admit: 2014-10-30 | Discharge: 2014-10-30 | Disposition: A | Discharge status: Home / Self Care | Payer: Medicaid Other | Attending: Emergency Medicine | Admitting: Emergency Medicine

## 2014-10-30 DIAGNOSIS — M545 Low back pain, unspecified: Secondary | ICD-10-CM

## 2014-10-30 DIAGNOSIS — Z8742 Personal history of other diseases of the female genital tract: Secondary | ICD-10-CM | POA: Diagnosis not present

## 2014-10-30 DIAGNOSIS — R55 Syncope and collapse: Secondary | ICD-10-CM | POA: Diagnosis not present

## 2014-10-30 DIAGNOSIS — Z87448 Personal history of other diseases of urinary system: Secondary | ICD-10-CM | POA: Diagnosis not present

## 2014-10-30 DIAGNOSIS — E0500 Thyrotoxicosis with diffuse goiter without thyrotoxic crisis or storm: Secondary | ICD-10-CM | POA: Insufficient documentation

## 2014-10-30 DIAGNOSIS — Z79899 Other long term (current) drug therapy: Secondary | ICD-10-CM | POA: Diagnosis not present

## 2014-10-30 DIAGNOSIS — R0789 Other chest pain: Secondary | ICD-10-CM | POA: Insufficient documentation

## 2014-10-30 DIAGNOSIS — R079 Chest pain, unspecified: Secondary | ICD-10-CM | POA: Diagnosis present

## 2014-10-30 LAB — COMPREHENSIVE METABOLIC PANEL
ALK PHOS: 71 U/L (ref 39–117)
ALT: 10 U/L (ref 0–35)
ANION GAP: 13 (ref 5–15)
AST: 13 U/L (ref 0–37)
Albumin: 4.6 g/dL (ref 3.5–5.2)
BUN: 12 mg/dL (ref 6–23)
CO2: 25 mEq/L (ref 19–32)
CREATININE: 1.01 mg/dL (ref 0.50–1.10)
Calcium: 9.6 mg/dL (ref 8.4–10.5)
Chloride: 100 mEq/L (ref 96–112)
GFR calc non Af Amer: 76 mL/min — ABNORMAL LOW (ref >90)
GFR, EST AFRICAN AMERICAN: 88 mL/min — AB (ref >90)
GLUCOSE: 100 mg/dL — AB (ref 70–99)
Potassium: 3.8 mEq/L (ref 3.7–5.3)
Sodium: 138 mEq/L (ref 137–147)
Total Bilirubin: 0.5 mg/dL (ref 0.3–1.2)
Total Protein: 8.5 g/dL — ABNORMAL HIGH (ref 6.0–8.3)

## 2014-10-30 LAB — CBC WITH DIFFERENTIAL/PLATELET
BASOS ABS: 0 10*3/uL (ref 0.0–0.1)
BASOS PCT: 1 % (ref 0–1)
Eosinophils Absolute: 0.1 10*3/uL (ref 0.0–0.7)
Eosinophils Relative: 1 % (ref 0–5)
HEMATOCRIT: 39.4 % (ref 36.0–46.0)
Hemoglobin: 13.6 g/dL (ref 12.0–15.0)
LYMPHS PCT: 30 % (ref 12–46)
Lymphs Abs: 2.6 10*3/uL (ref 0.7–4.0)
MCH: 30 pg (ref 26.0–34.0)
MCHC: 34.5 g/dL (ref 30.0–36.0)
MCV: 86.8 fL (ref 78.0–100.0)
MONO ABS: 0.5 10*3/uL (ref 0.1–1.0)
Monocytes Relative: 6 % (ref 3–12)
NEUTROS ABS: 5.5 10*3/uL (ref 1.7–7.7)
Neutrophils Relative %: 62 % (ref 43–77)
PLATELETS: 204 10*3/uL (ref 150–400)
RBC: 4.54 MIL/uL (ref 3.87–5.11)
RDW: 14.2 % (ref 11.5–15.5)
WBC: 8.8 10*3/uL (ref 4.0–10.5)

## 2014-10-30 LAB — TROPONIN I: Troponin I: <0.3 ng/mL (ref <0.30)

## 2014-10-30 LAB — LIPASE, BLOOD: LIPASE: 28 U/L (ref 11–59)

## 2014-10-30 MED ORDER — CYCLOBENZAPRINE HCL 10 MG PO TABS: 10.0000 mg | ORAL_TABLET | Freq: Three times a day (TID) | ORAL | Status: DC | PRN

## 2014-10-30 MED ORDER — TRAMADOL HCL 50 MG PO TABS
50.0000 mg | ORAL_TABLET | Freq: Four times a day (QID) | ORAL | Status: DC | PRN
Start: 2014-10-30 — End: 2016-01-14

## 2014-10-30 MED ORDER — ONDANSETRON HCL 4 MG/2ML IJ SOLN
4.0000 mg | Freq: Once | INTRAMUSCULAR | Status: AC
  Administered 2014-10-30: 4 mg via INTRAVENOUS
  Filled 2014-10-30: qty 2

## 2014-10-30 MED ORDER — RANITIDINE HCL 150 MG PO TABS: 150.0000 mg | ORAL_TABLET | Freq: Two times a day (BID) | ORAL | Status: DC

## 2014-10-30 MED ORDER — SUCRALFATE 1 GM/10ML PO SUSP: 1.0000 g | Freq: Three times a day (TID) | ORAL | Status: DC

## 2014-10-30 MED ORDER — HYDROMORPHONE HCL 1 MG/ML IJ SOLN
1.0000 mg | Freq: Once | INTRAMUSCULAR | Status: AC
  Administered 2014-10-30: 1 mg via INTRAVENOUS
  Filled 2014-10-30: qty 1

## 2014-10-30 NOTE — ED Provider Notes (Signed)
CSN: [id number]     Arrival date & time 10/30/14  1948 History  This chart was scribe for Gilda Creasehristopher J. Pollina, * by Angelene GiovanniEmmanuella Mensah, ED Scribe. The patient was seen in room APA10/APA10 and the patient's care was started at 9:23 PM.    Chief Complaint  Patient presents with  . Chest Pain   The history is provided by the patient. No language interpreter was used.   HPI Comments: Carolyn [name] is a 28 y.o. female with a hx of thyroid disease who presents to the Emergency Department complaining of intermittent CP onset over a month and a gradually worsening constant lower back pain onset 3 days ago. She reports blacking out yesterday once and once today. She explains that the back pain is worse on exertion. No alleviating factors noted. She reports an allergy to aspirin. She reports trouble with her kidneys in the past.   Past Medical History  Diagnosis Date  . Thyroid disease   . Grave's disease   . Ovarian cyst   . Acute kidney injury    Past Surgical History  Procedure Laterality Date  . Thyroid surgery    . Fracture surgery    . Dilation and curettage of uterus    . Dilation and curettage of uterus  05/10/2012    Procedure: DILATATION AND CURETTAGE;  Surgeon: Tilda BurrowJohn V Ferguson, MD;  Location: [facility];  Service: Gynecology;  Laterality: N/A;  suction  . Iud removal  05/10/2012    Procedure: INTRAUTERINE DEVICE (IUD) REMOVAL;  Surgeon: Tilda BurrowJohn V Ferguson, MD;  Location: [facility];  Service: Gynecology;;  . Ankle surgery Left     pin put in  . Dilation and curettage of uterus N/A 06/19/2013    Procedure: DILATATION AND SUCTION CURETTAGE;  Surgeon: Tilda BurrowJohn V Ferguson, MD;  Location: [facility];  Service: Gynecology;  Laterality: N/A;   Family History  Problem Relation Age of Onset  . Anesthesia problems Neg Hx   . Hypotension Neg Hx   . Malignant hyperthermia Neg Hx   . Pseudochol deficiency Neg Hx   . Diabetes Mother   . Hypertension Mother   . Hyperlipidemia Mother   . Cancer Mother   .  Hypertension Father   . Hyperlipidemia Father   . Cancer Maternal Grandmother     uterine   . Heart disease Maternal Grandfather    History  Substance Use Topics  . Smoking status: Never Smoker   . Smokeless tobacco: Never Used  . Alcohol Use: Yes     Comment: social   OB History    Gravida Para Term Preterm AB TAB SAB Ectopic Multiple Living   2 1 1  1  1   1      Review of Systems  Constitutional: Negative for fever and chills.  Cardiovascular: Positive for chest pain.  Musculoskeletal: Positive for back pain.  Neurological: Positive for syncope.      Allergies  Aleve; Iodine; and Tylenol  Home Medications   Prior to Admission medications   Medication Sig Start Date End Date Taking? Authorizing Provider  levothyroxine (SYNTHROID, LEVOTHROID) 150 MCG tablet Take 1 tablet (150 mcg total) by mouth daily before breakfast. Patient taking differently: Take 175 mcg by mouth daily before breakfast.  09/24/14  Yes Lesle ChrisKaren M Black, NP  ibuprofen (ADVIL,MOTRIN) 200 MG tablet Take 400 mg by mouth every 6 (six) hours as needed for moderate pain.    Historical Provider, MD  medroxyPROGESTERone (DEPO-PROVERA) 150 MG/ML injection Inject 150 mg into  the muscle every 3 (three) months.    Historical Provider, MD  Multiple Vitamins-Minerals (MULTIVITAMIN WITH MINERALS) tablet Take 1 tablet by mouth daily.    Historical Provider, MD  oxyCODONE-acetaminophen (PERCOCET/ROXICET) 5-325 MG per tablet Take 1 tablet by mouth every 6 (six) hours as needed. 10/10/14   Benny LennertJoseph L Zammit, MD  oxyCODONE-acetaminophen (PERCOCET/ROXICET) 5-325 MG per tablet Take 1 tablet by mouth every 6 (six) hours as needed. 10/10/14   Benny LennertJoseph L Zammit, MD   BP 123/90 mmHg  Pulse 90  Temp(Src) 98 F (36.7 C) (Oral)  Resp 18  Ht 5\' 7"  (1.702 m)  Wt 210 lb (95.255 kg)  BMI 32.88 kg/m2  SpO2 97%  LMP 09/10/2014 Physical Exam  ED Course  Procedures (including critical care time) DIAGNOSTIC STUDIES: Oxygen Saturation  is 100% on RA, normal by my interpretation.    COORDINATION OF CARE: 9:26 PM- Pt advised of plan for treatment and pt agrees.    Labs Review Labs Reviewed  COMPREHENSIVE METABOLIC PANEL - Abnormal; Notable for the following:    Glucose, Bld 100 (*)    Total Protein 8.5 (*)    GFR calc non Af Amer 76 (*)    GFR calc Af Amer 88 (*)    All other components within normal limits  CBC WITH DIFFERENTIAL  LIPASE, BLOOD  TROPONIN I    Imaging Review No results found.   EKG Interpretation   Date/Time:  Wednesday October 30 2014 19:56:37 EST Ventricular Rate:  88 PR Interval:  156 QRS Duration: 72 QT Interval:  358 QTC Calculation: 433 R Axis:   73 Text Interpretation:  Normal sinus rhythm Low voltage QRS Cannot rule out  Anterior infarct , age undetermined Abnormal ECG No significant change  since last tracing Confirmed by POLLINA  MD, CHRISTOPHER [phone]) on  10/30/2014 8:06:34 PM      MDM   Final diagnoses:  None   chest pain Back pain  Patient presents to the ER for evaluation of chest pains. Patient reports that she has been having continuous pains in her chest for a month. Patient was admitted to the hospital one month ago with a syncopal episode. At that time she was found to be mixed edematous, TSH was greater than 100. She has been following up with an endocrinologist. Her TSH has been decreasing and she recently had her thyroid dose increased. I do not suspect that she has any significant hypothyroidism at this point based on this recent history.  It is not clear what is causing her pain. Patient was admitted and worked up for chest pain recently. She does not have any significant cardiac risk factors. Pain has been constant for the month since it first occurred, therefore no concern for PE.  Workup today was unremarkable. Patient is experiencing lower back pain as well. This appears to be musculoskeletal in nature, worsens with movement. She does not have any  neurologic deficit associated with the pain.  Treated with analgesia here in the ER. Patient had CT scan of abdomen performed 2 months ago that did not show any intra-abdominal problems, including showing a normal gallbladder. Low suspicion for gallbladder disease, but GI causes of the chest pain is certainly possible is not likely. Will refer to GI for further evaluation of her chest pain.  I personally performed the services described in this documentation, which was scribed in my presence. The recorded information has been reviewed and is accurate.    Gilda Creasehristopher J. Pollina, MD 10/30/14 [phone]

## 2014-10-30 NOTE — ED Notes (Signed)
Several months of chest pains, and "thyroid problems", "blackout spells". Worked up by PMD and an admission here. Today states the chest/back pain is "unbearable". No vomiting but has had 3-4 diarrhea stools

## 2014-10-30 NOTE — Discharge Instructions (Signed)
Chest Pain (Nonspecific) °It is often hard to give a specific diagnosis for the cause of chest pain. There is always a chance that your pain could be related to something serious, such as a heart attack or a blood clot in the lungs. You need to follow up with your health care provider for further evaluation. °CAUSES  °· Heartburn. °· Pneumonia or bronchitis. °· Anxiety or stress. °· Inflammation around your heart (pericarditis) or lung (pleuritis or pleurisy). °· A blood clot in the lung. °· A collapsed lung (pneumothorax). It can develop suddenly on its own (spontaneous pneumothorax) or from trauma to the chest. °· Shingles infection (herpes zoster virus). °The chest wall is composed of bones, muscles, and cartilage. Any of these can be the source of the pain. °· The bones can be bruised by injury. °· The muscles or cartilage can be strained by coughing or overwork. °· The cartilage can be affected by inflammation and become sore (costochondritis). °DIAGNOSIS  °Lab tests or other studies may be needed to find the cause of your pain. Your health care provider may have you take a test called an ambulatory electrocardiogram (ECG). An ECG records your heartbeat patterns over a 24-hour period. You may also have other tests, such as: °· Transthoracic echocardiogram (TTE). During echocardiography, sound waves are used to evaluate how blood flows through your heart. °· Transesophageal echocardiogram (TEE). °· Cardiac monitoring. This allows your health care provider to monitor your heart rate and rhythm in real time. °· Holter monitor. This is a portable device that records your heartbeat and can help diagnose heart arrhythmias. It allows your health care provider to track your heart activity for several days, if needed. °· Stress tests by exercise or by giving medicine that makes the heart beat faster. °TREATMENT  °· Treatment depends on what may be causing your chest pain. Treatment may include: °¨ Acid blockers for  heartburn. °¨ Anti-inflammatory medicine. °¨ Pain medicine for inflammatory conditions. °¨ Antibiotics if an infection is present. °· You may be advised to change lifestyle habits. This includes stopping smoking and avoiding alcohol, caffeine, and chocolate. °· You may be advised to keep your head raised (elevated) when sleeping. This reduces the chance of acid going backward from your stomach into your esophagus. °Most of the time, nonspecific chest pain will improve within 2-3 days with rest and mild pain medicine.  °HOME CARE INSTRUCTIONS  °· If antibiotics were prescribed, take them as directed. Finish them even if you start to feel better. °· For the next few days, avoid physical activities that bring on chest pain. Continue physical activities as directed. °· Do not use any tobacco products, including cigarettes, chewing tobacco, or electronic cigarettes. °· Avoid drinking alcohol. °· Only take medicine as directed by your health care provider. °· Follow your health care provider's suggestions for further testing if your chest pain does not go away. °· Keep any follow-up appointments you made. If you do not go to an appointment, you could develop lasting (chronic) problems with pain. If there is any problem keeping an appointment, call to reschedule. °SEEK MEDICAL CARE IF:  °· Your chest pain does not go away, even after treatment. °· You have a rash with blisters on your chest. °· You have a fever. °SEEK IMMEDIATE MEDICAL CARE IF:  °· You have increased chest pain or pain that spreads to your arm, neck, jaw, back, or abdomen. °· You have shortness of breath. °· You have an increasing cough, or you cough   up blood.  You have severe back or abdominal pain.  You feel nauseous or vomit.  You have severe weakness.  You faint.  You have chills. This is an emergency. Do not wait to see if the pain will go away. Get medical help at once. Call your local emergency services (911 in [place] Do not drive  yourself to the hospital. MAKE SURE YOU:   Understand these instructions.  Will watch your condition.  Will get help right away if you are not doing well or get worse. Document Released: 09/08/2005 Document Revised: 12/04/2013 Document Reviewed: 07/04/2008 [facility] Patient Information 2015 [employer] [place]. This information is not intended to replace advice given to you by your health care provider. Make sure you discuss any questions you have with your health care provider.  Back Pain, Adult Back pain is very common. The pain often gets better over time. The cause of back pain is usually not dangerous. Most people can learn to manage their back pain on their own.  HOME CARE   Stay active. Start with short walks on flat ground if you can. Try to walk farther each day.  Do not sit, drive, or stand in one place for more than 30 minutes. Do not stay in bed.  Do not avoid exercise or work. Activity can help your back heal faster.  Be careful when you bend or lift an object. Bend at your knees, keep the object close to you, and do not twist.  Sleep on a firm mattress. Lie on your side, and bend your knees. If you lie on your back, put a pillow under your knees.  Only take medicines as told by your doctor.  Put ice on the injured area.  Put ice in a plastic bag.  Place a towel between your skin and the bag.  Leave the ice on for 15-20 minutes, 03-04 times a day for the first 2 to 3 days. After that, you can switch between ice and heat packs.  Ask your doctor about back exercises or massage.  Avoid feeling anxious or stressed. Find good ways to deal with stress, such as exercise. GET HELP RIGHT AWAY IF:   Your pain does not go away with rest or medicine.  Your pain does not go away in 1 week.  You have new problems.  You do not feel well.  The pain spreads into your legs.  You cannot control when you poop (bowel movement) or pee (urinate).  Your arms or legs feel weak or  lose feeling (numbness).  You feel sick to your stomach (nauseous) or throw up (vomit).  You have belly (abdominal) pain.  You feel like you may pass out (faint). MAKE SURE YOU:   Understand these instructions.  Will watch your condition.  Will get help right away if you are not doing well or get worse. Document Released: 05/17/2008 Document Revised: 02/21/2012 Document Reviewed: 04/02/2014 [facility] Patient Information 2015 [place], [place]. This information is not intended to replace advice given to you by your health care provider. Make sure you discuss any questions you have with your health care provider.

## 2015-04-06 NOTE — Discharge Summary (Signed)
PATIENT NAME:  Carolyn [name], Sander R MR#:  [id number] DATE OF BIRTH:  [birth year]  DATE OF ADMISSION:  01/11/2012 DATE OF DISCHARGE:  01/21/2012  HISTORY OF PRESENT ILLNESS: Carolyn [name] is a 29 year old female admitted to the Inpatient Behavioral Unit on the 29th of January due to suicidal ideation with a plan to drive her car off a cliff. She also reported with a urinalysis positive for cocaine. She stated that she had no knowledge of using cocaine; however, she also stated that she had frequent blackout periods since she developed severe hypothyroidism. She did present with a TSH over 100. She stated that she had blacked out for a whole Saturday. When she woke up on the Sunday morning, she felt like she was hung over from some substance use. She denied any history of substance abuse.   Her orientation and memory function were intact. She was suffering from depressed mood, poor concentration, and anhedonia in addition to the above chief complaint.   ANCILLARY CLINICAL DATA:  Neurology was consulted regarding the patient's history of severe migraines. Neurology performed a thorough work-up and concluded that the headache could be secondary to her severe hypothyroidism. They had no other acute headache medication to offer other than ibuprofen, which was continued at 600 mg q.i.d. p.r.n. As the hospital course progressed, Ms. Gibbs experienced less headache, and this did correlate with improvement in her thyroid function tests.   Dr. Tedd SiasSolum was consulted for the patient's hypothyroidism. Her Synthroid dose was increased to 200 mcg per day. By February 8th, her free thyroxine had improved to normal at 0.86. The patient's depressive symptoms correlated with an improvement in the patient's free thyroxine as well as the change to Cymbalta.   HOSPITAL COURSE: Carolyn [name] was admitted to the Inpatient Behavioral Unit and underwent milieu and group psychotherapy. As her energy improved and headache improved, she  progressively participated in group therapy.   By February 1st, she was still experiencing severe symptoms of depression along with suicidal ideation; however, over the following week her energy improved to 80% of normal. She developed consistent interest and hope. Her suicidal ideation resolved, and she became more alert with normal concentration.   CONDITION ON DISCHARGE: By February 8th, Carolyn [name] demonstrates normal concentration, normal eye contact, normal interests and normal hope. She is not having any hallucinations or delusions. She is requiring Klonopin along with hydroxyzine for insomnia. She does require Klonopin during the day for feeling on edge as well as muscle tension. She is not experiencing any adverse medication effects. Carolyn [name] has not demonstrated any increase in obsessive or compulsive symptoms.   MENTAL STATUS EXAM UPON DISCHARGE: Carolyn [name] is alert with good eye contact. Her concentration is normal. She is oriented to all spheres. Abstraction is intact. Fund of knowledge, intelligence, and use of language are normal. Speech involves normal rate and prosody without dysarthria. Thought process is logical, coherent, and goal directed without tangents or looseness of associations. Thought content: No thoughts of harming herself, no thoughts of harming others. No delusions. No hallucinations. Affect is mildly flat at baseline but with a broad and appropriate range as the patient progresses through the interview. Mood is normal. Judgment is intact. Insight is intact.   DISCHARGE DIAGNOSES:  AXIS I:  1. Major depressive disorder, recurrent, stable.  2. Anxiety disorder, not otherwise specified, well controlled.  3. 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 This diagnosis was entered in order to emphasize that the patient's hypothyroid state likely had an impact on her  mood symptoms.   AXIS II: Deferred.   AXIS III:  1. Migraine headaches. 2. Hypothyroidism.   AXIS IV: General  medical, primary support group.   AXIS V: 55.   Carolyn [name] is not at risk to harm herself or others. She agrees to call Emergency Services immediately for any thoughts of harming herself, thoughts of harming others, or distress. She agrees to not drive if drowsy.   DIET: Regular.   ACTIVITY: Routine.   DISCHARGE MEDICATIONS:  1. Fluticasone propionate nasal spray, 1 spray bilateral nostrils daily. 2. Clonazepam 0.5 mg, 1/2 to 1 b.i.d. p.r.n. anxiety and 1 p.o. at bedtime p.r.n. insomnia 1 hour after the Vistaril. 3. Trazodone 100 mg at bedtime to augment Cymbalta.  4. Levothyroxine 200 mcg 1 in the a.m.  5. Hydroxyzine 25 mg, 1 to 3 p.o. every 6 hours p.r.n. anxiety or insomnia. (The patient is to use this as the first line for antianxiety during the day or anti-insomnia at night, only using the Klonopin as back-up p.r.n.)  6. Cymbalta 40 mg 1 p.o. daily. The patient will have a 10-day supply of all the above medications, enough to carry her over to her outpatient psychiatric appointment and her outpatient general medical appointments.   FOLLOWUP:   1. Follow up with private psychiatrist at [facility] on 01/28/2012.  2. She will follow up with her endocrinologist during the first week of discharge.  ____________________________ Adelene Amas. Latayvia Mandujano, MD jsw:cbb D: 01/21/2012 16:15:06 ET T: 01/22/2012 10:33:00 ET JOB#: [id number]  cc: Adelene Amas. Tasneem Cormier, MD, <Dictator> Lester [place] MD ELECTRONICALLY SIGNED 01/28/2012 20:02

## 2015-04-06 NOTE — Consult Note (Signed)
PATIENT NAME:  Carolyn [name], PELLUM MR#:  [id number] DATE OF BIRTH:  09-15-1986  DATE OF CONSULTATION:  01/13/2012  REFERRING PHYSICIAN:  Adelene Amas. Williford, MD CONSULTING PHYSICIAN:  A. Wendall Mola, MD  CHIEF COMPLAINT: Hypothyroidism.   HISTORY OF PRESENT ILLNESS: This is a 29 year old female seen in consultation for a very elevated TSH. She is admitted to the Behavioral Medicine Unit with major depression and suicidal ideation. The patient is well known to me from the [facility]. She initially presented to our clinic in March 2011 with evidence of hyperthyroidism. At that time, she was also reporting dysphagia and a sense of discomfort and a globus sensation on swallowing. She was started on methimazole, initially, however could not tolerate that medication so was switched to propylthiouracil. She was seen again in May 2011, at which time she reported ongoing extreme discomfort on swallowing. She was having severe anxiety associated with this. A thyroid ultrasound did reveal a goiter without any evidence of thyroid nodules. As she had difficulty tolerating the methimazole and had neck discomfort, she was sent for thyroidectomy.  That was performed in May 2011 here at [facility]. She developed postsurgical hypothyroidism and was placed on oral levothyroxine, however despite increasing doses of oral levothyroxine, her thyroid function tests did not normalize. She was then placed on IM levothyroxine, however continued to have abnormal thyroid function tests. When I evaluated the patient in May 2012, I was concerned about medication noncompliance. I had contacted her pharmacies and was told she was not filling prescriptions as expected. I then asked her parents to watch her self administer the injections of levothyroxine as well as the oral levothyroxine. After she did this for several weeks, her thyroid function tests definitely improved. Unfortunately at that point  [name] stopped following up with [place] and she has not been seen in our clinic since May 2012. She now reports that she is following at [facility] because she lacked the funds to travel to our clinic. I did contact [facility] and was able to speak to a nurse there who confirms she has been seen on several dates. She had not had any levothyroxine prescriptions sent that they could identify. They did have one set of thyroid function tests that were performed on 11/09/2011 at which point her TSH was very low at 0.06, free 4 was low at 1.3, and total T3 was 3.2. The patient explains that about six weeks ago, which would coincide with those labs, she was told to decrease her oral levothyroxine from 300 mcg per day to 150 mcg per day. It is not clear when she stopped taking the IM levothyroxine, however according to her pharmacies, she has not filled the prescription for IM levothyroxine since July and her last prescription for oral levothyroxine was in October when she was given 30 tabs only. Again, Ameerah reports that she has been compliant with 150 mcg of levothyroxine every day for the last six weeks. She states she is taking her medication each morning on an empty stomach. She has been feeling increasingly sluggish and depressed since this dose change was made in late November. She also reports worsening headaches. She complains of cold intolerance. She complains of hair loss.   PAST MEDICAL HISTORY: Postsurgical hypothyroidism.   PAST SURGICAL HISTORY: Total thyroidectomy on 04/14/2010.  CURRENT INPATIENT MEDICATIONS:  1. Levothyroxine 150 mcg daily.  2. Zoloft 50 mg daily.  3. Thiamine 100 mg daily.  4. Multivitamin 1  tab daily.  5. Fluticasone one spray b.i.d.  6. Thiamine 100 mg daily.   SOCIAL HISTORY: The patient is single. She has one son, Carolyn [name], age 123. She lives with her parents. She denies smoking. She does occasionally drink alcohol. She reports rare use of illicit drugs,  although states that at a recent party she may have taken some illicit drugs that she cannot recall. Her drug screen on admission was positive for cocaine.   FAMILY HISTORY: No known thyroid disease.   REVIEW OF SYSTEMS: HEENT: She has headaches. She denies blurred vision. NECK: She denies neck pain or dysphagia. CARDIAC: She denies chest pain or palpitation. PULMONARY: She denies cough or shortness of breath. ABDOMEN: She denies abdominal pain. She denies constipation. She reports fair appetite. EXTREMITIES: She denies leg swelling. GENERAL: She denies fever. She does report a 15 pound weight gain in recent months. ENDO: She reports cold intolerance. No heat intolerance. HEME: She denies easy bruisability or recent bleeding.   PHYSICAL EXAMINATION:  VITAL SIGNS: Temperature 98.1, pulse 50-70, respiratory rate 20, blood pressure 124/90.   GENERAL: A well-developed, well-nourished white female in no acute distress.   HEENT: Extraocular movements are intact.   OROPHARYNX: Clear. Mucous membranes moist.   NECK: Supple. Well-healed linear scar in the lower neck. No palpable thyroid tissue. No neck lymphadenopathy.   CARDIAC: Regular rate and rhythm without murmur.   PULMONARY: Clear to auscultation bilaterally.   ABDOMEN: Diffusely soft, nontender, nondistended.   EXTREMITIES: No swelling is present.   SKIN: No rash or dermatopathy. No jaundice.   PSYCHIATRIC: Alert and oriented, calm and cooperative.   LABORATORY, DIAGNOSTIC, AND RADIOLOGICAL DATA: On 01/11/2012 TSH greater than 100, free T4 0.24.   ASSESSMENT: This is a 29 year old female with postsurgical hypothyroidism, severely uncontrolled. I attribute her uncontrolled hypothyroidism to medication noncompliance. She had evidence of good compliance in November when she actually had iatrogenic hyperthyroidism on 300 mcg per day, although it is not clear how long she had been taking that dose. She did fill a prescription for  levothyroxine in October so perhaps she had been taking it for about one month at that point.  RECOMMENDATIONS:  1. I agree with use of levothyroxine. Recommend increasing the dose to 200 mcg for the next three days and then resuming 175 mcg per day.  2. Repeat free T4 in three days. Expect it to be more normal; however still may not be within normal range. No need to repeat TSH for at least 4 to 6 weeks as there is always a lag in TSH normalization after adjustment of thyroid hormone dosage.   Thank you for the kind request for consultation.  ____________________________ A. Wendall MolaMelissa Kameela Leipold, MD  cc: Ellie LunchBhakti Paul, MD  ams:rbg D:  01/13/2012 14:48:23 ET  T:  01/14/2012 09:08:33 ET  JOB#: [id number] A. MELISSA Rahul Malinak MD ELECTRONICALLY SIGNED 01/17/2012 17:12

## 2015-04-06 NOTE — Consult Note (Signed)
PATIENT NAME:  Carolyn [name], Carolyn R MR#:  [id number] DATE OF BIRTH:  1986/03/14  DATE OF CONSULTATION:  04/12/2012  REFERRING PHYSICIAN:  Jolanta B. Jennet MaduroPucilowska, MD CONSULTING PHYSICIAN:  A. Wendall MolaMelissa Leana Springston, MD  CHIEF COMPLAINT: Hypothyroidism.   HISTORY OF PRESENT ILLNESS: This is a 29 year old female who was admitted yesterday for anxiety who has a history of post surgical hypothyroidism. The patient is well known to me from her prior hospitalization as well as clinic visits. She has not been seen in clinic in over a year. She underwent a total thyroidectomy on 04/14/2010 due to a goiter with compressive symptoms. Since that time she has been hospitalized three times with uncontrolled hypothyroidism with a TSH > 100. Previously when she has had supervised administration of levothyroxine, her thyroid function tests have improved, however once she is discharged and told to take levothyroxine on her own, she always returns with very severely uncontrolled hypothyroidism. At one point in time, I had her stepmother supervising injections and oral administration of levothyroxine which was successful. She now claims to be following at [facility] [place]. Again, she claims good compliance with levothyroxine 200 mcg per day. Labs yesterday showed her TSH was above the level of detection at greater than 100. She complains of fatigue. She complains of hair loss. She denies cold intolerance. She denies constipation. She reports her mood has not improved much since admission two days ago.   On presentation to the ED two days ago, she found out that she was pregnant. She recalls her last menstrual period was in January. Her estimated gestational age is about five weeks. She has had a long history of irregular menses. She claims to be in a long-term relationship with her boyfriend, who is the father of the baby. She plans to continue with the pregnancy.   PAST MEDICAL HISTORY:  1. History of goiter  with compressive symptoms status post total thyroidectomy in May 2011.  2. Postsurgical hypothyroidism.  3. Anxiety and depression.   PAST SURGICAL HISTORY: Total thyroidectomy May 2011.   CURRENT INPATIENT MEDICATIONS: Seroquel 100 mg at bedtime.   SOCIAL HISTORY: The patient is single. She has a young child. She lives with her father and step-mother. She has had cocaine abuse with a positive drug screen for cocain on this admission and again on a prior admission in 01/2012. FAMILY HISTORY: No known thyroid disease.   REVIEW OF SYSTEMS: HEENT: Denies blurred vision. Denies headaches at this time. NECK: Denies dysphasia. Denies neck pain. CARDIAC: Denies chest pain. Denies palpitation. PULMONARY: Denies cough. Denies shortness of breath. ABDOMEN: Reports normal appetite. Denies abdominal pain. EXTREMITIES: Denies swelling. MUSCULOSKELETAL: Denies unusual arthralgias or myalgias. ENDOCRINE: Denies heat or cold intolerance. Denies polyuria or polydipsia. HEME: Denies easy bruisability or recent bleeding. SKIN: Denies rash or pruritus.   PHYSICAL EXAMINATION:  VITAL SIGNS: Temperature 98.3, pulse 80, respirations 20, blood pressure 135/81.   GENERAL:  Overweight white female in no distress.    HEENT: Extraocular movements are intact. Oropharynx is clear. Mucous membranes moist.   NECK: Supple. There is a well-healed linear scar in the lower neck. No palpable neck mass.   LYMPH: No submandibular anterior cervical lymphadenopathy.   CARDIAC: Regular rate and rhythm without murmur.   PULMONARY: Clear to auscultation bilaterally. No wheeze or rhonchi. Normal inspiratory effort.   ABDOMEN: Diffusely soft, nontender, nondistended. Positive bowel sounds.   EXTREMITIES: No edema is present.   SKIN: No dermatopathy or rash is present. No alopecia is  present.   PSYCHIATRIC: Alert and oriented, calm, and cooperative.   LABORATORY: April 10, 2012 - TSH greater than 100. Glucose 94, BUN 12,  creatinine 0.94, sodium 137, potassium 4, chloride 102. Ethanol less than 3. HCG 233. Urine drug screen positive for cocaine. Hematocrit 38.5%. Urinalysis unremarkable.   ASSESSMENT: 29 year old female with postsurgical hypothyroidism, uncontrolled due to noncompliance of levothyroxine. She denies noncompliance, however repeatedly has proven in the past that once her levothyroxine administration is supervised she has improvement in her thyroid function tests which confirms recent noncompliance.   RECOMMENDATIONS:  1. Given her pregnancy, our goal TSH would be in the range of about 0.5 to 2.0. I explained to her that she is very far from the goal and due to her uncontrolled hypothyroidism she is at risk for problems with her fetus to include severe mental impairment. She was strongly encouraged to follow-up regularly as an outpatient and I explained to her that I typically follow pregnant women with hypothyroidism every month.  2. For now, will give slightly increased dose of levothyroxine at 300 mcg per day and I plan to double her dose twice a week to 400 mcg per day, therefore giving that dose tomorrow and next Monday. All other days she will continue on her typical outpatient dose of 200 mcg per day. I will obtain repeat thyroid function tests next week.  Thank you for the kind request for consultation.   ____________________________ A. Wendall Mola, MD ams:rbg D: 04/12/2012 17:22:50 ET T: 04/13/2012 09:46:24 ET JOB#: [id number]  cc: A. Wendall Mola, MD, <Dictator> Macy Mis MD ELECTRONICALLY SIGNED 04/13/2012 16:10

## 2015-04-06 NOTE — H&P (Signed)
PATIENT NAME:  Carolyn [name], Carolyn [name] MR#:  [id number] DATE OF BIRTH:  1986/09/12  DATE OF ADMISSION:  04/10/2012  REFERRING PHYSICIAN: Daryel November, MD   ATTENDING PHYSICIAN: Kristine Linea, MD  IDENTIFYING DATA: [name] is a 29 year old female with history of depression and anxiety.   CHIEF COMPLAINT: "I have panic attacks. I want to jump in front of a car and cut my wrists."  HISTORY OF PRESENT ILLNESS: [name] was hospitalized at [facility] at the end of January beginning of February. She was discharged to home on a combination of Cymbalta, clonazepam, and trazodone. She discontinued her medication. According to different conversations she could have done it three days ago, most likely she stopped taking her medication just after she was discharged from the hospital. She reports worsening of depression, feeling of guilt, hopelessness, worthlessness, poor energy and concentration, decreased sleep, some weight loss, anhedonia, social isolation, and incapacitating panic attacks, but also auditory hallucinations. She started experiencing weird dreams in which she killed someone or she herself was killed. She developed pervasive suicidal ideation with plethora of plans, drive off a bridge, cut, or jump in front of a car. She minimizes her substance abuse but from reviewing her chart it  appears that every weekend the patient goes to a party where she drinks excessively and experiences blackouts, but also uses cocaine. She minimizes the problem. In the emergency room, she discovered that she is pregnant. Her TSH is 100, elevated again. She has a longstanding problem with thyroid since thyroid surgery for Graves' disease. Initially she told the nurse that she has not been taking Synthroid. Today she tells me that she only stopped taking it 3 to 4 days ago. She denies symptoms suggestive of bipolar mania.   PAST PSYCHIATRIC HISTORY: She reports the depression and anxiety started  when she was really young, less than 29 years old. She has been treated with Zoloft and Xanax. Cymbalta worked better for her. She denies other hospitalizations prior to 2013. She denies suicide attempts.   FAMILY PSYCHIATRIC HISTORY: Her father has a diagnosis of schizoaffective disorder and has been hospitalized five times.   PAST MEDICAL HISTORY:  1. Status post thyroidectomy secondary to Graves' disease. 2. Hypothyroidism. 3. Chronic headaches.   MEDICATIONS ON ADMISSION: None.   ALLERGIES: Aleve and iodine.   SOCIAL HISTORY: She has a 55-year-old son. She lives with her mother and father. She sometimes spends weekends with her boyfriend and that is why she is pregnant now. She is surprised by her pregnancy as they were using condoms. She wants to keep the baby.   REVIEW OF SYSTEMS: CONSTITUTIONAL: No fevers or chills. Positive for fatigue. No weight changes. EYES: No double or blurred vision. ENT: No hearing loss. RESPIRATORY: No shortness of breath or cough. CARDIOVASCULAR: No chest pain or orthopnea. GASTROINTESTINAL: No abdominal pain, nausea, vomiting, or diarrhea. GU: No incontinence or frequency. ENDOCRINE: No heat or cold intolerance. LYMPHATIC: No anemia or easy bruising. INTEGUMENTARY: No acne or rash. MUSCULOSKELETAL: No muscle or joint pain.  NEUROLOGIC: No tingling or weakness. PSYCHIATRIC: See history of present illness for details.   PHYSICAL EXAMINATION:   VITAL SIGNS: Blood pressure 147/96, pulse 71, respirations 20, and temperature 98.4.   GENERAL: This is a well-developed female in no acute distress.   HEENT: The pupils are equal, round, and reactive to light. Sclera anicteric.   NECK: Supple. No thyromegaly.   LUNGS: Clear to auscultation. No dullness to percussion.   HEART: Regular rhythm  and rate. No murmurs, rubs, or gallops.   ABDOMEN: Soft, nontender, and nondistended. Positive bowel sounds.   MUSCULOSKELETAL: Normal muscle strength in all extremities.    SKIN: No rashes or bruises.   LYMPHATIC: No cervical adenopathy.   NEUROLOGIC: Cranial nerves II through XII are intact.  LABS/STUDIES: Chemistries are within normal limits. Blood alcohol level is zero. LFTs are within normal limits. TSH is over 100. Urine tox screen is positive for cocaine. CBC is within normal limits. Urinalysis is not suggestive of urinary tract infection. Serum acetaminophen and salicylates are low. Urine pregnancy test is positive. Beta-hCG 233.  MENTAL STATUS EXAMINATION ON ADMISSION: The patient is alert and oriented to person, place, time, and situation. She is pleasant, polite, and cooperative. She is well groomed and casually dressed. She maintains good eye contact. Her speech is soft. Mood is depressed with flat affect. Thought processing is logical and goal oriented. Thought content: She denies suicidal or homicidal ideation, but came to the hospital reporting intrusive thoughts of suicide with a plan. There are no delusions or paranoia. She does endorse auditory hallucinations but no commands. Her cognition is grossly intact. She registers three out of three and recalls three out of three objects after 5 minutes. She can spell world forward and backward. She can do serial sevens and knows the current president. Her insight and judgment are fair.   SUICIDE RISK ASSESSMENT: This is a patient with a history of depression, mood instability, and panic attacks but also ongoing substance abuse that she minimizes who developed suicidal ideations.   DIAGNOSES:  AXIS I:  1. Mood disorder, not otherwise specified.  2. Panic disorder without agoraphobia. 3. Cocaine abuse.  4. Alcohol abuse.   AXIS II: Deferred.   AXIS III: Pregnant, hypothyroidism.   AXIS IV: Mental illness, substance abuse, physical illness, treatment compliance, financial.   AXIS V: GAF on admission 25.   PLAN: The patient was admitted to [facility] Behavioral Medicine unit  for safety, stabilization and medication management. She was initially placed on suicide precautions and was closely monitored for any unsafe behavior. She underwent full psychiatric and risk assessment. She received pharmacotherapy, individual and group psychotherapy, substance abuse counseling, and support from therapeutic milieu.  1. Suicidality: This has resolved. She is able to contract for safety.  2. Mood: She is pregnant. Zoloft was not so good for her, but will go back to Zoloft. I suspect that she is  never truly compliant.  3. Anxiety: I will not prescribe benzodiazepines for this pregnant patient.  4. Pregnancy: High-risk with hypothyroidism and cocaine abuse. We will [facility] for advice.  5. Hypothyroidism: The patient is already known to Dr. Tedd SiasSolum, our endocrinologist. We will ask for a consultation. 6. Substance abuse: The patient would be an excellent candidate for [facility]. She has Media plannerprivate insurance. We will offer IOP. 7. Disposition: She will be discharged to home with her parents.  ____________________________ Ellin GoodieJolanta B. Jennet MaduroPucilowska, MD jbp:slb D: 04/11/2012 13:46:27 ET T: 04/11/2012 14:43:40 ET JOB#: [id number]  cc: Jolanta B. Jennet MaduroPucilowska, MD, <Dictator> Shari ProwsJOLANTA B PUCILOWSKA MD ELECTRONICALLY SIGNED 04/13/2012 11:36

## 2015-04-06 NOTE — Consult Note (Signed)
Allergies:  Iodine: Other, N/V/Diarrhea  Aleve: Headaches  Other -Explain in Comment: Rash, N/V/Diarrhea, Hives  Assessment/Plan:   Assessment/Plan Patient was seen, examined, and chart was reviewed. Patient is well known to me from clinic. She had a history of mild hyperthyroidism without the presence of thyroid autoanitbodies in 02/2010. She did not tolerate methimazole and had complaints of dysphagia and an uncomfortable globus sensation on swallowing. For these reasons, she underwent total thyroidectomy in 04/2010. Her post-opertive hypothyroidism was difficult to control and it was suspected that this was due to medication non compliance. Her pharmacy records indicated that she was not filling prescriptions as expected and therefore not taking her medication. I have again reviewed her pharmacy records and found that she has last received 30 tabs of levothyroxine in early October.  A / Severely uncontrolled post-surgical hypothyroidism due to medication non-compliance.  P / Agree with oral levothyroxine alone. Will give 200 mcg daily x 3 days and then 150 mcg daily thereafter. Will recehck free T4 in 4 days.  Full consult has been dictated.   Electronic Signatures: Raj JanusSolum, Anna M (MD)  (Signed 31-Jan-13 14:51)  Willis ModenaAuthoredForrestine Him: ALLERGIES, Assessment/Plan   Last Updated: 31-Jan-13 14:51 by Raj JanusSolum, Anna M (MD)

## 2015-04-06 NOTE — Consult Note (Signed)
Referral Information:   Reason for Referral Positive HCG and admission for suicidal ideation to the Behavioral med unit at [facility]    Referring Physician Pucilowska J, Sol[facility] (Endo)    Prenatal Hx 29 yo G 2 p1001 separated white female with irregular menses and an HCG of 230 on 4/30 noted during admission for suicidal threats. She denies pain, bleeding and only rare nausea. She wants to continue the pregnancy even though it is unplanned.She discontinued her Depoprovera 6 months ago and has been using condoms . She has several medical issues: 1-Depression /suicidal ideation requiring current psychiatric admission. Patient reports anxiety and insomnia have figured into her decompensation. She has felt bad and her relapse into cocaine use was an attempt to feel better. She has been using Klonopin and cymbalta. She ran out of cymbalta a week before admission. As an inpt ,she was started on Zoloft and Seroquel.  She did not sleep well last night after arguing with her new partner Aaron Edelman about the pregnancy. 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 2- Hypothyroidism - TSH >100 , 2011 thyroidectomy for graves - she has an iodine allergy and was not an ablation candidate.She is followed by dr Gabriel Carina and reports difficulty in regulating her levels despite taking her medications. She reports her psych issues getting much worse since her thyroid was removed . 3-Cocaine use - pt states she had stopped using  but relapsed after running into  some old friends , Aaron Edelman does not use drugs and she sees him as a stabilizing force in her life. She reports being highly motivated to never use again. She sees pregnancy as helping her avoid use. 4. Headaches- saw neurology at [facility]- no clear cause, started after thyroidectomy . [facility] had given her triptans which did not help "made my body burn" She uses tylenol mutliple times per day.  Cymbalta was chosen to do double duty for her depression and her HA . She has had no HA since stopping her cymbalta last week.    Past Obstetrical Hx 07/2008 [facility] - spontaneous vaginal delivery 8 lb 3 oz female "Kae Heller" pt reports a mild shoulder dystocia, baby was fine no sequelae, uncomplicated pregnancy , no depression no HTN or DM , postpartum reports a "uterine cyst" that resolved with antibiotics? Breastfed for 9 months - this pregnancy was with her ex-husband   Home Medications: Medication Instructions Status  Flonase 50 mcg/inh nasal spray 1 spray(s) nasal once a day Active  Synthroid 200 mcg (0.2 mg) oral tablet 1 tab(s) orally once a day Active  Klonopin 0.5 mg oral tablet 2 tab(s) orally once a day (at bedtime), As Needed- for Inability to Sleep  Active  vistaril   25 milligram(s) orally , 1 to 3 caps/tabs 4 times a day As Needed for anxiety Active  Cymbalta 60 milligram(s) orally once a day Active  Klonopin 0.5 mg oral tablet 1 tab(s) orally every 6 hours, As Needed for Anxiety Active   Allergies:   Iodine: Other, N/V/Diarrhea  Aleve: Headaches  Other -Explain in Comment: Rash, N/V/Diarrhea, Hives  Vital Signs/Notes:  Nursing Vital Signs: **Vital Signs.:   02-May-13 07:16   Vital Signs Type Routine   Temperature Temperature (F) 98.5   Celsius 36.9   Pulse Pulse 89   Respirations Respirations 20   Systolic BP Systolic BP 938   Diastolic BP (mmHg) Diastolic BP (mmHg) 68  Systolic BP Systolic BP 009   Diastolic BP (mmHg) Diastolic BP (mmHg) 78   Perinatal Consult:   LMP 14-Dec-2011    PGyn Hx irregular cycles, no STIs , stopped depoprovera 7 months age    71 Hx reports upto date on immunizations    Past Medical History cont'd See HPI    PSurg Hx thyroidectomy    FHx Her Father has schizoaffective disorder"he has been on everything"    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    Occupation Father Aaron Edelman  - Dealer and a gardener he has 3 heatlhy children from another relationship , no exposures    Soc Hx drugs, sseparated , realtionship with Aaron Edelman, arged last night  about pregnancy, mom and Dad are divorced but supportive, "my stepmom is a Hotel manager"   Review Of Systems:   Subjective sleepy from being upset last night, some mild nausea , no breast tendernes and no abd pain    Abdominal Pain No    Nausea/Vomiting No  mild only    Tolerating Diet Yes    Medications/Allergies Reviewed Medications/Allergies reviewed  Iodine alleve   Exam:   Neck scar     Routine Hem:  29-Apr-13 18:54    WBC (CBC) 8.1   RBC (CBC) 4.28   Hemoglobin (CBC) 12.8   Hematocrit (CBC) 38.5   Platelet Count (CBC) 196   MCV 90   MCH 30.0   MCHC 33.3   RDW 14.0  Routine Chem:  29-Apr-13 18:54    Glucose, Serum 94   BUN 12   Creatinine (comp) 0.94   Sodium, Serum 137   Potassium, Serum 4.0   Chloride, Serum 102   CO2, Serum 27   Calcium (Total), Serum 8.8  Hepatic:  29-Apr-13 18:54    Bilirubin, Total 0.4   Alkaline Phosphatase 84   SGPT (ALT) 33   SGOT (AST) 29   Total Protein, Serum 8.0   Albumin, Serum 4.4  Routine Chem:  29-Apr-13 18:54    Osmolality (calc) 273   eGFR (African American) >60   eGFR (Non-African American) >60   Anion Gap 8   Ethanol, S. < 3   Ethanol % (comp) < 0.003  General Ref:  [id number]-HWE-[id number] 37:16    Salicylates, Serum < 1.7   Acetaminophen, Serum < 2  Routine UA:  29-Apr-13 18:54    Color (UA) Yellow   Clarity (UA) Hazy   Glucose (UA) Negative   Bilirubin (UA) Negative   Ketones (UA) Negative   Specific Gravity (UA) 1.021   Blood (UA) Negative   pH (UA) 5.0   Protein (UA) Negative   Nitrite (UA) Negative   Leukocyte Esterase (UA) Negative   RBC (UA) 1 /HPF   WBC (UA) 5 /HPF   Epithelial Cells (UA) 1 /HPF   Mucous (UA) PRESENT  Routine Sero:  29-Apr-13 18:54    Pregnancy Test, Urine POSITIVE  Urine Drugs:  96-VEL-38 10:17    Tricyclic Antidepressant,  Ur Qual (comp) NEGATIVE   Amphetamines, Urine Qual. NEGATIVE   MDMA, Urine Qual. NEGATIVE   Cocaine Metabolite, Urine Qual. POSITIVE   Opiate, Urine qual NEGATIVE   Phencyclidine, Urine Qual. NEGATIVE   Cannabinoid, Urine Qual. NEGATIVE   Barbiturates, Urine Qual. NEGATIVE   Benzodiazepine, Urine Qual. NEGATIVE   Methadone, Urine Qual. NEGATIVE  Thyroid:  29-Apr-13 18:54    Thyroid Stimulating Hormone > 100.0  Routine Chem:  29-Apr-13 18:54    HCG Betasubunit Quant. Serum  233     Additional Lab/Radiology Notes HCG 233 4/29   Impression/Recommendations:   Impression Early pregnancy -desired likely 4-5 weeks gest age  (2-3 wks from conception)  Depression requring hospitalization, insomnia, anxiety requires medicaitons  Cocaine use intermittent-she did not report ethanol or tobacco use  Hyopthyroidism- (surprising she ovulated at a TSH of 100) I reviewed risk of worse neurologic outcome for fetus with hypothyroidism in mother H/o headache h/o mild shoulder dystocia with first delivery of 8 lb infant    Recommendations 1- Confirm healthy pregnancy - check another quantitative HCG serum - it should have  doubled from her last HCG. If so and no abd pain or bleeding, schedule pelvic ultrasound in 2-3 weeks to confirm intrauterine location and viability. We are happy to do follow up consult and ultrasound in the [place] perin[facility] clinic on a mon or thursday - Kila RN to schedule. She will need a primary oB as well in the local community. If her HCG is NOT rising - she will need further evaluation to r/o Spontaneous ab , ectopic or heterophile ab. Start a folic acid containing vitamin. Check a Type and screen, urine but other OB labs (RPR,HIV, Hep B , Rubella antibody )can await enrollment into prenatal care. 2- Aggresively correct hypothyroidism with synthroid as outlined by Dr Gabriel Carina in her consult. Pt has a working relationship with [facility] and understands need for med  adherence . We discussed pill boxes and phone alarms as 2 strategies for adherence - pt says she will follow up and understands possible fetal consequences if she remains hypothyroid.  3-psychiatric medications: I reviewed Zoloft, Cymbalta, Seroquel, trazodone, Ambien and Klonopin with the patient. I told her that given her decompensation that medications were indicated and that any in the above list would be reasonable in the first trimester with the risk /benefit ratio tipping heavily toward benefit in her case.I offered reprotox printouts to her. I told her that OB and Peds is most comfortable with Zoloft as it is msot frequently used but the preference woudl be for efficacious medicaitons used in less number at the lowest efficacious dose.  I recommmend avoiding lIthium, Paxil , valproic acid or other antiepileptcs  in early pregnancy  if possible . I told her if needed nightly for insomnia I preferred she use Seroquel. I told we like to taper benzos as delivery approaches but if Klonopin is required now for her to function normally then she should use it at the lowest efficacious dose.   Plan:   Comment/Plan Check HCG today order Type and Screen (to r/o rh neg in case of SAB) start prenatal vitamin continue psych meds as indicated  order pelvic u/s in 2 weeks Schedule new oB visit in next month at discharge     Comments plan continued 3. Cocaine use - pt denies major issue - backslide related to old companions. If cannot discotninue there are medicaid inpt facilities available - [facility] , [facility] is an option 4. Headaches - seem stable for now advised to avoid oversue of tylenol     Total Time Spent with Patient 30 minutes    >50% of visit spent in couseling/coordination of care yes    Office Use Only [employer] Focused (40 min)   Coding Description: MATERNAL CONDITIONS/HISTORY INDICATION(S).   Drug Use:  Alcohol or Cocaine.   Drug Use:   SSRI's (Paxil, Zoloft, Prozac, Celexa, Cymbalta).   Thyroid dysfunction  in pregnancy.   OTHER:  depression.  Electronic Signatures: Sharyn Creamer (MD)  (Signed [phone] 12:42)  Authored: Referral, Home Medications, Allergies, Vital Signs/Notes, Consult, Exam, Lab, Lab/Radiology Notes, Impression, Plan, Other Comments, Billing, Coding Description   Last Updated: 02-May-13 12:42 by Sharyn Creamer (MD)

## 2015-04-06 NOTE — Consult Note (Signed)
Chief Complaint and History:   Referring Physician Dr. Jennet MaduroPucilowska    Chief Complaint Uncontrolled hypothyroidism   Allergies:  Iodine: Other, N/V/Diarrhea  Aleve: Headaches  Other -Explain in Comment: Rash, N/V/Diarrhea, Hives  Assessment/Plan:   Assessment/Plan Patient was seen and examined. She is a 29 yo F well known to me with post-surgical hypothyroidism. She has been chronically non-complaint with her levothyroxine. She claims complaince however her TSH is >100. In the past, her thyroid function tests have always improved after supervised surveillance of daily levothyroxine. Previously, an daily dose of 200 mcg daily has been sufficient. She is now pregnant, EGA ~5 weeks, and she plans to continue with the pregnancy.  A /  Uncontrolled post surgical hypothyroidism  P/ -Give levothyroxine 300 mcg now  -Give levothyroxine 400 mcg each Thurs and Sun (double dose 2x weekly for pregnancy) -Give 200 mcg all other days -She was counseled about the risk to the fetus of mental impairment  A full consult will be dicatated. I will follow along with you.   Electronic Signatures: Raj JanusSolum, Sandee Bernath M (MD)  (Signed [phone]-May-13 08:40)  Authored: Chief Complaint and History, ALLERGIES, HOME MEDICATIONS, Assessment/Plan   Last Updated: 03-May-13 08:40 by Raj JanusSolum, Quinci Gavidia M (MD)

## 2015-04-06 NOTE — H&P (Signed)
PATIENT NAME:  Carolyn [name], Carolyn [name] MR#:  [id number] DATE OF BIRTH:  1986-04-12  DATE OF ADMISSION:  01/11/2012  CHIEF COMPLAINT AND IDENTIFYING DATA: [name] is a 29 year old female presenting to the Emergency Department with a chief complaint of feeling like she is going crazy. She has suicidal ideation with a plan to drive her car off a cliff.    HISTORY OF PRESENT ILLNESS: [name] takes care of a 28-year-old toddler. The father of the baby is only intermittently present and is only intermittently paying child support. [name] also suffers from a history of Graves' disease with uncontrolled thyroid levels, a current TSH around 100. She does have a father with schizoaffective disorder and she is afraid she is becoming like him. She does suffer from severe anxiety attacks that involve fear, thoughts of doom and dread, palpitations, feeling on edge, muscle tension and sweats. They last approximately 15 minutes. They do not occur per day and are averaging every other day.   She has no hallucinations or delusions. Her memory and orientation function are intact.   She does acknowledge drinking 7 to 8 beers, but not every day. She will drink 7 to 8 beers per day occasionally on weekend days. She has been suffering from depressed mood, decreased energy, anhedonia, difficulty concentrating and suicidal thoughts for over two weeks.   She does have a history of some excessive worry on a regular basis   ____________________________ Adelene AmasJames S. Oceana Walthall, MD jsw:cms D: 01/12/2012 14:37:08 ET T: 01/12/2012 15:07:18 ET JOB#: [id number]  cc: Adelene AmasJames S. Hatsuko Bizzarro, MD, <Dictator> Lester CarolinaJAMES S Dujuan Stankowski MD ELECTRONICALLY SIGNED 01/14/2012 11:14

## 2015-04-06 NOTE — H&P (Signed)
PATIENT NAME:  Carolyn RakesDURHAM, Lanyia R MR#:  [id number] DATE OF BIRTH:  10/09/1986  DATE OF ADMISSION:  01/11/2012  COMPLETING AND ADMISSION HISTORY AND PHYSICAL DICTATION  I WILL PROCEED WITH THE PAST PSYCHIATRIC HISTORY  PAST PSYCHIATRIC HISTORY: [name] reports a history of multiple episodes of periods lasting longer than two weeks where she has experienced depressed mood, anhedonia, poor concentration, poor appetite, as well as thoughts of hopelessness and helplessness. She also has experienced suicidal ideation often during those periods; however, she has never made a suicide attempt.   She denies any history of elevated mood, elevated energy, racing thoughts or decreased need for sleep. She has reported to the intake staff the possibility of hearing some voices, however, upon further questioning by the undersigned these were not hallucinations.   Her onset of depression and anxiety began before her she was 29 years old. She does have a long-term history of worry along with feeling on edge, muscle tension and decreased need for sleep. She has panic attacks as reported above. She has been given Xanax 0.25 to 0.5 mg b.i.d. p.r.n. panic. She has been given Zoloft titrated to 100 mg every a.m. for antidepression. The Xanax has helped with anxiety, however, she has not seen any an improvement of her mood symptoms with the Zoloft yet.   She has no history of other psychotropic trials.    In review of the past medical record there is no mentioning of alcohol problems or psychiatric problems. History and physical dictation as well as consultations at the medical center were reviewed going back to May 2011.   FAMILY PSYCHIATRIC HISTORY: Ms. Deirdre PeerDurham's father has schizoaffective disorder and he has been tried on multiple medications. He has undergone eight psychiatric admissions.   SOCIAL HISTORY: [name] lives with her stepmother and her father as well as her 28-year-old son. Please see the above discussion.    She has no history of abuse. She is educated through high school. She works at an [employer] business.   She is not married. She does have a boyfriend currently. The father of the baby visits occasionally. She uses no illegal drugs.   PAST MEDICAL HISTORY:  1. Graves' disease with thyroidectomy and subsequent hypothyroidism. Please see the discussion above. 2. History of chronic headaches after the thyroid surgery. 3. History of left ankle fracture with pinning.   MEDICATIONS:  1. Zoloft 100 mg daily. 2. Xanax as above. 3. Synthroid 150 mcg daily.   ALLERGIES: Aleve, iodine. She also is allergic to cherries.   LABORATORY, DIAGNOSTIC AND RADIOLOGICAL DATA: Urinalysis is unremarkable. Complete metabolic panel is unremarkable. TSH is greater than 100. CBC unremarkable. Urine pregnancy test negative. Urinalysis positive for cocaine. Free thyroxine was 0.24.   REVIEW OF SYSTEMS: Constitutional, HEENT, mouth, neurologic, psychiatric, cardiovascular, respiratory, gastrointestinal, genitourinary, skin, musculoskeletal, hematologic, lymphatic, endocrine, metabolic all unremarkable.   PHYSICAL EXAMINATION:  VITAL SIGNS: Temperature 98.3, pulse 74, respiratory rate 16, blood pressure 127/90.   GENERAL APPEARANCE: [name] is a young female appearing her chronologic age with normal gait and no abnormal involuntary movements. She has no cachexia. Her muscle tone is normal. Her grooming and hygiene are normal.   [name] is alert. Her eye contact is good. Her concentration is mildly decreased. She is oriented to all spheres. Her memory is intact to immediate, recent, and remote. Her fund of knowledge, use of language, and intelligence are within normal limits. Her speech is mildly slowed, there is normal prosody. There is no dysarthria.  Thought process is logical, coherent, and goal directed without looseness of associations or tangents. Abstraction is intact. Thought content: She has suicidal ideation.  She does contract for safety in the hospital. She has no thoughts of harming others. She has no delusions or hallucinations. Affect is constricted with tears. Mood is depressed. Insight is intact for need of treatment and her symptoms. Judgment is intact for providing informed consent for psychiatric care.   ASSESSMENT:  AXIS I:  1. Major depressive disorder, recurrent, severe.  2. Panic disorder without agoraphobia.  3. Rule out generalized anxiety disorder.  4. Rule out mood disorder due to hypothyroidism (there may be a component of hypothyroidism in her depression; however, she reports a history of major depression prior to thyroid abnormalities).   AXIS II: Deferred.   AXIS III:  1. History of Graves' disease. 2. Thyroidectomy. 3. Secondary hypothyroidism.   AXIS IV: Primary support group, economic, general medical.   AXIS V: 30.   [name] would be at risk for suicide outside of the supportive environment of the hospital.   RECOMMENDATIONS:  1. Therefore, will admit Ms.  to an inpatient psychiatric unit for further evaluation and treatment. She will undergo milieu and group psychotherapy.  2. The potential suicide risk and extreme suffering that the patient experiences with panic attacks was weighed versus the risk of using alcohol with benzodiazepines. The patient agrees to abstain from alcohol. AA is recommended.  3. Also an intensive outpatient program for alcohol abstinence is recommended particularly in the context of using a benzodiazepine for anti- panic.  4. The indications, alternatives and adverse effects of Klonopin were discussed with the patient for anti-acute anxiety including the risk of dependence and potential lethal effect if combined with alcohol. The indications, alternatives and adverse effects of Zoloft were discussed for antidepression and antianxiety. The patient understands the above information and agrees to the following: Will raise her Zoloft to 150  mg daily. The long term goal will be getting her Zoloft up to 200 mg daily for at least 16 weeks combined with cognitive behavioral therapy with the goal of potentially reducing and even eliminating her need for Klonopin.  5. Regarding antidepression the Zoloft increase will be the first trial, she may require augmentation or switching to another agent.  6. Will consult endocrinology and order preliminarily a thyroid panel.  ____________________________ Adelene Amas. Inell Mimbs, MD jsw:cms D: 01/12/2012 15:32:26 ET T: 01/12/2012 16:12:02 ET JOB#: [id number]  cc: Adelene Amas. Tamekia Rotter, MD, <Dictator> [place] MD ELECTRONICALLY SIGNED 01/14/2012 11:14

## 2015-05-23 ENCOUNTER — Other Ambulatory Visit: Payer: Self-pay

## 2015-05-23 ENCOUNTER — Encounter (HOSPITAL_COMMUNITY): Payer: Self-pay | Admitting: *Deleted

## 2015-05-23 ENCOUNTER — Emergency Department (HOSPITAL_COMMUNITY)
Admission: EM | Admit: 2015-05-23 | Discharge: 2015-05-23 | Discharge status: Against Medical Advice | Payer: Medicaid Other | Attending: Emergency Medicine | Admitting: Emergency Medicine

## 2015-05-23 DIAGNOSIS — R079 Chest pain, unspecified: Secondary | ICD-10-CM | POA: Diagnosis present

## 2015-05-23 NOTE — ED Notes (Signed)
Pt called for placement with no response

## 2015-05-23 NOTE — ED Notes (Signed)
Chest pain for the past 2 days, worse with movement

## 2015-08-25 LAB — HEMOGLOBIN A1C: HEMOGLOBIN A1C: 16

## 2015-08-25 LAB — TSH: TSH: 16 u[IU]/mL — AB (ref <=5.90)

## 2015-12-30 LAB — TSH: TSH: 43.53 u[IU]/mL — AB (ref <=5.90)

## 2015-12-30 LAB — HEMOGLOBIN A1C: HEMOGLOBIN A1C: 43.53

## 2016-01-05 ENCOUNTER — Encounter: Payer: Medicaid Other | Admitting: "Endocrinology

## 2016-01-05 NOTE — Progress Notes (Signed)
This encounter was created in error - please disregard.

## 2016-01-14 ENCOUNTER — Emergency Department (HOSPITAL_COMMUNITY): Payer: Medicaid Other

## 2016-01-14 ENCOUNTER — Emergency Department (HOSPITAL_COMMUNITY)
Admission: EM | Admit: 2016-01-14 | Discharge: 2016-01-14 | Disposition: A | Discharge status: Home / Self Care | Payer: Medicaid Other | Attending: Emergency Medicine | Admitting: Emergency Medicine

## 2016-01-14 ENCOUNTER — Encounter (HOSPITAL_COMMUNITY): Payer: Self-pay | Admitting: Emergency Medicine

## 2016-01-14 DIAGNOSIS — Z8742 Personal history of other diseases of the female genital tract: Secondary | ICD-10-CM | POA: Insufficient documentation

## 2016-01-14 DIAGNOSIS — R51 Headache: Secondary | ICD-10-CM | POA: Insufficient documentation

## 2016-01-14 DIAGNOSIS — Z87448 Personal history of other diseases of urinary system: Secondary | ICD-10-CM | POA: Insufficient documentation

## 2016-01-14 DIAGNOSIS — Z8679 Personal history of other diseases of the circulatory system: Secondary | ICD-10-CM | POA: Diagnosis not present

## 2016-01-14 DIAGNOSIS — Z79899 Other long term (current) drug therapy: Secondary | ICD-10-CM | POA: Insufficient documentation

## 2016-01-14 DIAGNOSIS — E079 Disorder of thyroid, unspecified: Secondary | ICD-10-CM | POA: Insufficient documentation

## 2016-01-14 DIAGNOSIS — R110 Nausea: Secondary | ICD-10-CM | POA: Diagnosis not present

## 2016-01-14 DIAGNOSIS — H53149 Visual discomfort, unspecified: Secondary | ICD-10-CM | POA: Insufficient documentation

## 2016-01-14 DIAGNOSIS — R519 Headache, unspecified: Secondary | ICD-10-CM

## 2016-01-14 HISTORY — DX: Migraine, unspecified, not intractable, without status migrainosus: G43.909

## 2016-01-14 LAB — CBC WITH DIFFERENTIAL/PLATELET
Basophils Absolute: 0.1 10*3/uL (ref 0.0–0.1)
Basophils Relative: 1 %
EOS ABS: 0.2 10*3/uL (ref 0.0–0.7)
EOS PCT: 3 %
HCT: 44.4 % (ref 36.0–46.0)
Hemoglobin: 15 g/dL (ref 12.0–15.0)
Lymphocytes Relative: 31 %
Lymphs Abs: 2.4 10*3/uL (ref 0.7–4.0)
MCH: 29.4 pg (ref 26.0–34.0)
MCHC: 33.8 g/dL (ref 30.0–36.0)
MCV: 86.9 fL (ref 78.0–100.0)
MONO ABS: 0.3 10*3/uL (ref 0.1–1.0)
Monocytes Relative: 4 %
Neutro Abs: 4.8 10*3/uL (ref 1.7–7.7)
Neutrophils Relative %: 61 %
Platelets: 194 10*3/uL (ref 150–400)
RBC: 5.11 MIL/uL (ref 3.87–5.11)
RDW: 12.9 % (ref 11.5–15.5)
WBC: 7.9 10*3/uL (ref 4.0–10.5)

## 2016-01-14 LAB — COMPREHENSIVE METABOLIC PANEL
ALT: 18 U/L (ref 14–54)
ANION GAP: 10 (ref 5–15)
AST: 23 U/L (ref 15–41)
Albumin: 5.3 g/dL — ABNORMAL HIGH (ref 3.5–5.0)
Alkaline Phosphatase: 59 U/L (ref 38–126)
BILIRUBIN TOTAL: 0.9 mg/dL (ref 0.3–1.2)
BUN: 9 mg/dL (ref 6–20)
CO2: 24 mmol/L (ref 22–32)
Calcium: 9.6 mg/dL (ref 8.9–10.3)
Chloride: 106 mmol/L (ref 101–111)
Creatinine, Ser: 0.83 mg/dL (ref 0.44–1.00)
GFR calc Af Amer: >60 mL/min (ref >60)
Glucose, Bld: 76 mg/dL (ref 65–99)
POTASSIUM: 4.2 mmol/L (ref 3.5–5.1)
Sodium: 140 mmol/L (ref 135–145)
TOTAL PROTEIN: 8.4 g/dL — AB (ref 6.5–8.1)

## 2016-01-14 MED ORDER — HYDROMORPHONE HCL 1 MG/ML IJ SOLN
1.0000 mg | Freq: Once | INTRAMUSCULAR | Status: AC
  Administered 2016-01-14: 1 mg via INTRAVENOUS
  Filled 2016-01-14: qty 1

## 2016-01-14 MED ORDER — TRAMADOL HCL 50 MG PO TABS: 50.0000 mg | ORAL_TABLET | Freq: Four times a day (QID) | ORAL | Status: DC | PRN

## 2016-01-14 MED ORDER — METOCLOPRAMIDE HCL 5 MG/ML IJ SOLN
10.0000 mg | Freq: Once | INTRAMUSCULAR | Status: AC
  Administered 2016-01-14: 10 mg via INTRAVENOUS
  Filled 2016-01-14: qty 2

## 2016-01-14 MED ORDER — HYDROMORPHONE HCL 1 MG/ML IJ SOLN
1.0000 mg | Freq: Once | INTRAMUSCULAR | Status: AC
Start: 2016-01-14 — End: 2016-01-14
  Administered 2016-01-14: 1 mg via INTRAVENOUS
  Filled 2016-01-14: qty 1

## 2016-01-14 MED ORDER — DIPHENHYDRAMINE HCL 50 MG/ML IJ SOLN
25.0000 mg | Freq: Once | INTRAMUSCULAR | Status: AC
  Administered 2016-01-14: 25 mg via INTRAVENOUS
  Filled 2016-01-14: qty 1

## 2016-01-14 MED ORDER — LORAZEPAM 2 MG/ML IJ SOLN
0.5000 mg | Freq: Once | INTRAMUSCULAR | Status: AC
  Administered 2016-01-14: 0.5 mg via INTRAVENOUS
  Filled 2016-01-14: qty 1

## 2016-01-14 MED ORDER — KETOROLAC TROMETHAMINE 30 MG/ML IJ SOLN
30.0000 mg | Freq: Once | INTRAMUSCULAR | Status: AC
  Administered 2016-01-14: 30 mg via INTRAVENOUS
  Filled 2016-01-14: qty 1

## 2016-01-14 NOTE — ED Notes (Signed)
Pt reports persistent headache, nausea, and light sensitivity since Sunday. Pt hx of migraines, but states "this feels different than my migraines." Pt states she fell on Saturday and is unsure if she hit her head. Denies any vomiting. AOx4.

## 2016-01-14 NOTE — ED Provider Notes (Addendum)
CSN: [id number]     Arrival date & time 01/14/16  1024 History  By signing my name below, I, Carolyn [name], attest that this documentation has been prepared under the direction and in the presence of Bethann Berkshire, MD. Electronically Signed: Angelene Giovanni, ED Scribe. 01/14/2016. 11:36 AM.     Chief Complaint  Patient presents with  . Headache   Patient is a 30 y.o. female presenting with headaches. The history is provided by the patient. No language interpreter was used.  Headache Pain location:  Occipital Quality:  Unable to specify Radiates to:  Does not radiate Onset quality:  Gradual Duration:  3 days Timing:  Constant Progression:  Worsening Chronicity:  New Similar to prior headaches: no   Context: bright light   Relieved by:  None tried Ineffective treatments:  None tried Associated symptoms: nausea and photophobia   Associated symptoms: no abdominal pain, no back pain, no congestion, no cough, no diarrhea, no fatigue, no seizures, no sinus pressure and no vomiting    HPI Comments: Carolyn [name] is a 30 y.o. female with a hx of migraines who presents to the Emergency Department complaining of gradually worsening constant non-radiating moderate occipital HA onset 3 days ago. She reports associated nausea and photophobia. She explains that she has a hx of migraines but they are normally characterized by a frontal HA and she vomits. She adds that she did not take her migraine medication due to this reason and she was not sure if her present symptoms are consistent with a migraine. She reports an allergy to Aleve, Tylenol and Iodine. She denies vomiting.   Past Medical History  Diagnosis Date  . Thyroid disease   . Grave's disease   . Ovarian cyst   . Acute kidney injury (HCC)   . Migraines    Past Surgical History  Procedure Laterality Date  . Thyroid surgery    . Fracture surgery    . Dilation and curettage of uterus    . Dilation and curettage of uterus  05/10/2012     Procedure: DILATATION AND CURETTAGE;  Surgeon: Tilda Burrow, MD;  Location: [facility];  Service: Gynecology;  Laterality: N/A;  suction  . Iud removal  05/10/2012    Procedure: INTRAUTERINE DEVICE (IUD) REMOVAL;  Surgeon: Tilda Burrow, MD;  Location: [facility];  Service: Gynecology;;  . Ankle surgery Left     pin put in  . Dilation and curettage of uterus N/A 06/19/2013    Procedure: DILATATION AND SUCTION CURETTAGE;  Surgeon: Tilda Burrow, MD;  Location: [facility];  Service: Gynecology;  Laterality: N/A;   Family History  Problem Relation Age of Onset  . Anesthesia problems Neg Hx   . Hypotension Neg Hx   . Malignant hyperthermia Neg Hx   . Pseudochol deficiency Neg Hx   . Diabetes Mother   . Hypertension Mother   . Hyperlipidemia Mother   . Cancer Mother   . Hypertension Father   . Hyperlipidemia Father   . Cancer Maternal Grandmother     uterine   . Heart disease Maternal Grandfather    Social History  Substance Use Topics  . Smoking status: Never Smoker   . Smokeless tobacco: Never Used  . Alcohol Use: Yes     Comment: social   OB History    Gravida Para Term Preterm AB TAB SAB Ectopic Multiple Living   Review of  Systems  Constitutional: Negative for appetite change and fatigue.  HENT: Negative for congestion, ear discharge and sinus pressure.   Eyes: Positive for photophobia. Negative for discharge.  Respiratory: Negative for cough.   Cardiovascular: Negative for chest pain.  Gastrointestinal: Positive for nausea. Negative for vomiting, abdominal pain and diarrhea.  Genitourinary: Negative for frequency and hematuria.  Musculoskeletal: Negative for back pain.  Skin: Negative for rash.  Neurological: Positive for headaches. Negative for seizures.  Psychiatric/Behavioral: Negative for hallucinations.      Allergies  Aleve; Iodine; and Tylenol  Home Medications   Prior to Admission medications   Medication Sig Start Date End Date  Taking? Authorizing Provider  cyclobenzaprine (FLEXERIL) 10 MG tablet Take 1 tablet (10 mg total) by mouth 3 (three) times daily as needed for muscle spasms. 10/30/14  Yes Gilda Crease, MD  ergocalciferol (DRISDOL) 50000 units capsule Take 1 capsule by mouth every Friday. 05/30/15  Yes Historical Provider, MD  gabapentin (NEURONTIN) 600 MG tablet Take 600 mg by mouth 3 (three) times daily. 01/02/16  Yes Historical Provider, MD  HYDROcodone-acetaminophen (NORCO) 10-325 MG tablet Take 1 tablet by mouth 4 (four) times daily as needed. 12/19/15  Yes Historical Provider, MD  medroxyPROGESTERone (DEPO-PROVERA) 150 MG/ML injection Inject 150 mg into the muscle every 3 (three) months.   Yes Historical Provider, MD  Multiple Vitamins-Minerals (MULTIVITAMIN WITH MINERALS) tablet Take 1 tablet by mouth daily.   Yes Historical Provider, MD  SUMAtriptan (IMITREX) 25 MG tablet take one tablet by mouth as needed for migraine pain 11/12/15  Yes Historical Provider, MD  TIROSINT 137 MCG CAPS Take 1 capsule by mouth every morning. 01/05/16  Yes Historical Provider, MD  levothyroxine (SYNTHROID, LEVOTHROID) 150 MCG tablet Take 1 tablet (150 mcg total) by mouth daily before breakfast. Patient not taking: Reported on 01/14/2016 09/24/14   Lesle Chris Black, NP   BP 139/105 mmHg  Pulse 75  Temp(Src) 98.6 F (37 C) (Oral)  Resp 16  Ht  (1.702 m)  Wt 220 lb (99.791 kg)  BMI 34.45 kg/m2  SpO2 100% Physical Exam  Constitutional: She is oriented to person, place, and time. She appears well-developed.  HENT:  Head: Normocephalic.  Eyes: Conjunctivae and EOM are normal. No scleral icterus.  Neck: Neck supple. No thyromegaly present.  Cardiovascular: Normal rate and regular rhythm.  Exam reveals no gallop and no friction rub.   No murmur heard. Pulmonary/Chest: No stridor. She has no wheezes. She has no rales. She exhibits no tenderness.  Abdominal: She exhibits no distension. There is no tenderness. There is no  rebound.  Musculoskeletal: Normal range of motion. She exhibits no edema.  Lymphadenopathy:    She has no cervical adenopathy.  Neurological: She is oriented to person, place, and time. She exhibits normal muscle tone. Coordination normal.  Skin: No rash noted. No erythema.  Psychiatric: She has a normal mood and affect. Her behavior is normal.    ED Course  Procedures (including critical care time) DIAGNOSTIC STUDIES: Oxygen Saturation is 100% on RA, normal by my interpretation.    COORDINATION OF CARE: 11:32 AM- Pt advised of plan for treatment and pt agrees. Pt will receive Toradol, Reglan, and Benadryl IM.    Labs Review Labs Reviewed - No data to display  Bethann Berkshire, MD has personally reviewed and evaluated these lab results as part of his medical decision-making.  MDM   Final diagnoses:  None    Labs and CT head negative. Patient's headache is improving  with treatment. Suspect this is an atypical migraine for this patient she will follow-up with her PCP as needed   The chart was scribed for me under my direct supervision.  I personally performed the history, physical, and medical decision making and all procedures in the evaluation of this patient.Bethann Berkshire, MD 01/14/16 1441  Bethann Berkshire, MD 02/19/16 2106

## 2016-01-14 NOTE — Discharge Instructions (Signed)
Follow up with your md if not improving. °

## 2016-02-04 ENCOUNTER — Ambulatory Visit (INDEPENDENT_AMBULATORY_CARE_PROVIDER_SITE_OTHER): Payer: Medicaid Other | Admitting: "Endocrinology

## 2016-02-04 ENCOUNTER — Encounter: Payer: Self-pay | Admitting: "Endocrinology

## 2016-02-04 VITALS — Ht 67.0 in

## 2016-02-04 DIAGNOSIS — E890 Postprocedural hypothyroidism: Secondary | ICD-10-CM

## 2016-02-04 MED ORDER — LEVOTHYROXINE SODIUM 150 MCG PO CAPS: 150.0000 ug | ORAL_CAPSULE | Freq: Every day | ORAL | Status: AC

## 2016-02-04 NOTE — Progress Notes (Signed)
Subjective:    Patient ID: Carolyn [name], female    DOB: 05-15-1986, PCP CLAGGETT,ELIN, PA-C   Past Medical History  Diagnosis Date  . Thyroid disease   . Grave's disease   . Ovarian cyst   . Acute kidney injury (HCC)   . Migraines    Past Surgical History  Procedure Laterality Date  . Thyroid surgery    . Fracture surgery    . Dilation and curettage of uterus    . Dilation and curettage of uterus  05/10/2012    Procedure: DILATATION AND CURETTAGE;  Surgeon: Tilda Burrow, MD;  Location: [facility];  Service: Gynecology;  Laterality: N/A;  suction  . Iud removal  05/10/2012    Procedure: INTRAUTERINE DEVICE (IUD) REMOVAL;  Surgeon: Tilda Burrow, MD;  Location: [facility];  Service: Gynecology;;  . Ankle surgery Left     pin put in  . Dilation and curettage of uterus N/A 06/19/2013    Procedure: DILATATION AND SUCTION CURETTAGE;  Surgeon: Tilda Burrow, MD;  Location: [facility];  Service: Gynecology;  Laterality: N/A;   Social History   Social History  . Marital Status: Legally Separated    Spouse Name: N/A  . Number of Children: N/A  . Years of Education: N/A   Social History Main Topics  . Smoking status: Never Smoker   . Smokeless tobacco: Never Used  . Alcohol Use: Yes     Comment: social  . Drug Use: No  . Sexual Activity: Yes    Birth Control/ Protection: Injection   Other Topics Concern  . None   Social History Narrative   Outpatient Encounter Prescriptions as of 02/04/2016  Medication Sig  . cyclobenzaprine (FLEXERIL) 10 MG tablet Take 1 tablet (10 mg total) by mouth 3 (three) times daily as needed for muscle spasms.  . ergocalciferol (DRISDOL) 50000 units capsule Take 1 capsule by mouth every Friday.  . gabapentin (NEURONTIN) 600 MG tablet Take 600 mg by mouth 3 (three) times daily.  Marland Kitchen HYDROcodone-acetaminophen (NORCO) 10-325 MG tablet Take 1 tablet by mouth 4 (four) times daily as needed.  . medroxyPROGESTERone (DEPO-PROVERA) 150 MG/ML injection Inject 150  mg into the muscle every 3 (three) months.  . Multiple Vitamins-Minerals (MULTIVITAMIN WITH MINERALS) tablet Take 1 tablet by mouth daily.  . SUMAtriptan (IMITREX) 25 MG tablet take one tablet by mouth as needed for migraine pain  . traMADol (ULTRAM) 50 MG tablet Take 1 tablet (50 mg total) by mouth every 6 (six) hours as needed.  . [DISCONTINUED] TIROSINT 137 MCG CAPS Take 1 capsule by mouth every morning.  . Levothyroxine Sodium (TIROSINT) 150 MCG CAPS Take 1 capsule (150 mcg total) by mouth daily before breakfast.  . [DISCONTINUED] levothyroxine (SYNTHROID, LEVOTHROID) 150 MCG tablet Take 1 tablet (150 mcg total) by mouth daily before breakfast. (Patient not taking: Reported on 01/14/2016)   No facility-administered encounter medications on file as of 02/04/2016.   ALLERGIES: Allergies  Allergen Reactions  . Aleve [Naproxen Sodium] Nausea And Vomiting    'makes headaches worse"  . Iodine Other (See Comments)    Causes blackouts  . Tylenol [Acetaminophen] Nausea And Vomiting   VACCINATION STATUS: Immunization History  Administered Date(s) Administered  . Influenza-Unspecified 09/12/2012  . Pneumococcal-Unspecified 09/12/2012    HPI 30 yr old female with medical hx as above. She is here to f/u for postsurgical hypothyroidism. she is on 137 mcg of Tirosint . she is now more compliant, feels better. She insists  that she has been consistent in taking her Tirosnt.   she did not tolerate synthroid and Levothyroxine in the past.  Review of Systems Constitutional: no weight gain/loss,  +fatigue, no subjective hyperthermia/hypothermia Eyes: no blurry vision, no xerophthalmia ENT: no sore throat, no nodules palpated in throat, no dysphagia/odynophagia, no hoarseness Cardiovascular: no CP/SOB/palpitations/leg swelling Respiratory: no cough/SOB Gastrointestinal: no N/V/D/C Musculoskeletal: no muscle/joint aches Skin: no rashes Neurological: no  tremors/numbness/tingling/dizziness Psychiatric: no depression/anxiety  Objective:    Ht  (1.702 m)  Wt Readings from Last 3 Encounters:  01/14/16 220 lb (99.791 kg)  05/23/15 220 lb (99.791 kg)  10/30/14 210 lb (95.255 kg)    Physical Exam Constitutional: overweight, in NAD Eyes: PERRLA, EOMI, no exophthalmos ENT: moist mucous membranes, post thyroidectomy scar on anterior lower neck, no cervical lymphadenopathy Cardiovascular: RRR, No MRG Respiratory: CTA B Gastrointestinal: abdomen soft, NT, ND, BS+ Musculoskeletal: no deformities, strength intact in all 4 Skin: moist, warm, no rashes Neurological: no tremor with outstretched hands, DTR normal in all 4  Her labs from US showed free T4 0.9, TSH 43.53    Assessment & Plan:   1. Postsurgical hypothyroidism I will increase her  Tirosint dose to 150 mcg po qam. she is also informed of the need to call us if she confirms pregnancy, since she will need higher dose of this hormone. she will return in 4 months with repeat labs.  I will increase her dose to 150 mcg po qam.    - We discussed about correct intake of levothyroxine, at fasting, with water, separated by at least 30 minutes from breakfast, and separated by more than 4 hours from calcium, iron, multivitamins, acid reflux medications (PPIs). -Patient is made aware of the fact that thyroid hormone replacement is needed for life, dose to be adjusted by periodic monitoring of thyroid function tests.  - I advised patient to maintain close follow up with [facility], PA-C for primary care needs. Follow up plan: Return in about 6 months (around 08/03/2016) for underactive thyroid, follow up with pre-visit labs.  Marquis Lunch, MD Phone: [phone]  Fax: [phone]   02/04/2016, 4:05 PM

## 2016-02-18 ENCOUNTER — Encounter: Payer: Self-pay | Admitting: "Endocrinology

## 2016-06-13 ENCOUNTER — Observation Stay
Admission: EM | Admit: 2016-06-13 | Discharge: 2016-06-14 | Disposition: A | Discharge status: Home / Self Care | Payer: Medicaid Other | Attending: Internal Medicine | Admitting: Internal Medicine

## 2016-06-13 ENCOUNTER — Encounter: Payer: Self-pay | Admitting: Emergency Medicine

## 2016-06-13 DIAGNOSIS — F4310 Post-traumatic stress disorder, unspecified: Secondary | ICD-10-CM | POA: Diagnosis present

## 2016-06-13 DIAGNOSIS — F429 Obsessive-compulsive disorder, unspecified: Secondary | ICD-10-CM | POA: Diagnosis present

## 2016-06-13 DIAGNOSIS — G8929 Other chronic pain: Secondary | ICD-10-CM | POA: Diagnosis not present

## 2016-06-13 DIAGNOSIS — E0500 Thyrotoxicosis with diffuse goiter without thyrotoxic crisis or storm: Secondary | ICD-10-CM | POA: Diagnosis not present

## 2016-06-13 DIAGNOSIS — F1420 Cocaine dependence, uncomplicated: Secondary | ICD-10-CM | POA: Diagnosis present

## 2016-06-13 DIAGNOSIS — E039 Hypothyroidism, unspecified: Secondary | ICD-10-CM | POA: Diagnosis not present

## 2016-06-13 DIAGNOSIS — R45851 Suicidal ideations: Secondary | ICD-10-CM | POA: Diagnosis not present

## 2016-06-13 DIAGNOSIS — F333 Major depressive disorder, recurrent, severe with psychotic symptoms: Secondary | ICD-10-CM | POA: Diagnosis present

## 2016-06-13 DIAGNOSIS — F4001 Agoraphobia with panic disorder: Secondary | ICD-10-CM | POA: Diagnosis not present

## 2016-06-13 DIAGNOSIS — M545 Low back pain: Secondary | ICD-10-CM | POA: Diagnosis not present

## 2016-06-13 DIAGNOSIS — N83209 Unspecified ovarian cyst, unspecified side: Secondary | ICD-10-CM | POA: Diagnosis not present

## 2016-06-13 HISTORY — DX: Anxiety disorder, unspecified: F41.9

## 2016-06-13 HISTORY — DX: Major depressive disorder, single episode, unspecified: F32.9

## 2016-06-13 HISTORY — DX: Bipolar disorder, unspecified: F31.9

## 2016-06-13 HISTORY — DX: Depression, unspecified: F32.A

## 2016-06-13 LAB — CBC
HCT: 39.5 % (ref 35.0–47.0)
Hemoglobin: 13.9 g/dL (ref 12.0–16.0)
MCH: 29.3 pg (ref 26.0–34.0)
MCHC: 35.1 g/dL (ref 32.0–36.0)
MCV: 83.5 fL (ref 80.0–100.0)
PLATELETS: 204 10*3/uL (ref 150–440)
RBC: 4.73 MIL/uL (ref 3.80–5.20)
RDW: 14.3 % (ref 11.5–14.5)
WBC: 9.7 10*3/uL (ref 3.6–11.0)

## 2016-06-13 LAB — URINE DRUG SCREEN, QUALITATIVE (ARMC ONLY)
AMPHETAMINES, UR SCREEN: NOT DETECTED
Barbiturates, Ur Screen: NOT DETECTED
Benzodiazepine, Ur Scrn: NOT DETECTED
Cannabinoid 50 Ng, Ur ~~LOC~~: NOT DETECTED
Cocaine Metabolite,Ur ~~LOC~~: POSITIVE — AB
MDMA (ECSTASY) UR SCREEN: NOT DETECTED
Methadone Scn, Ur: NOT DETECTED
Opiate, Ur Screen: NOT DETECTED
Phencyclidine (PCP) Ur S: NOT DETECTED
TRICYCLIC, UR SCREEN: NOT DETECTED

## 2016-06-13 LAB — SALICYLATE LEVEL

## 2016-06-13 LAB — COMPREHENSIVE METABOLIC PANEL
ALT: 10 U/L — ABNORMAL LOW (ref 14–54)
AST: 21 U/L (ref 15–41)
Albumin: 4.7 g/dL (ref 3.5–5.0)
Alkaline Phosphatase: 64 U/L (ref 38–126)
Anion gap: 10 (ref 5–15)
BUN: 13 mg/dL (ref 6–20)
CHLORIDE: 103 mmol/L (ref 101–111)
CO2: 24 mmol/L (ref 22–32)
Calcium: 8.8 mg/dL — ABNORMAL LOW (ref 8.9–10.3)
Creatinine, Ser: 1 mg/dL (ref 0.44–1.00)
GFR calc Af Amer: >60 mL/min (ref >60)
Glucose, Bld: 115 mg/dL — ABNORMAL HIGH (ref 65–99)
POTASSIUM: 3.5 mmol/L (ref 3.5–5.1)
SODIUM: 137 mmol/L (ref 135–145)
Total Bilirubin: 0.7 mg/dL (ref 0.3–1.2)
Total Protein: 7.8 g/dL (ref 6.5–8.1)

## 2016-06-13 LAB — TSH: TSH: 60.474 u[IU]/mL — AB (ref 0.350–4.500)

## 2016-06-13 LAB — ETHANOL

## 2016-06-13 LAB — ACETAMINOPHEN LEVEL: Acetaminophen (Tylenol), Serum: <10 ug/mL — ABNORMAL LOW (ref 10–30)

## 2016-06-13 MED ORDER — LORAZEPAM 1 MG PO TABS
1.0000 mg | ORAL_TABLET | Freq: Once | ORAL | Status: AC
  Administered 2016-06-13: 1 mg via ORAL
  Filled 2016-06-13: qty 1

## 2016-06-13 NOTE — ED Notes (Signed)
Pt here for beh med eval; says this past weeks she's been "out of sorts"; emotions swing back and forth; history of depression; pt admits to fleeting thoughts of suicide; "I'm afraid if I'm not monitored closely I will do something to myself"; "I am scared for myself"; pt says she has a history of substance abuse, was clean for about 3 years but did smoke crack Thursday; "I don't want to be home alone"; pt says she's been admitted here before and that helped her tremendously

## 2016-06-13 NOTE — ED Notes (Signed)
POCT URINE PREG, NEGATIVE. AS

## 2016-06-13 NOTE — ED Notes (Signed)
Blood obtained from left hand on 4th attempt; pt tolerated well

## 2016-06-13 NOTE — ED Provider Notes (Addendum)
Time Seen: Approximately 2220 I have reviewed the triage notes  Chief Complaint: Suicidal   History of Present Illness: Carolyn [name] is a 30 y.o. female who states that she's had a history of bipolar disease with depression. She states that she is concerned because she is having some erratic thoughts and has had some transient suicidal thoughts with no plan. Patient states she is afraid to be alone "". She states that she has no active auditory or visual hallucinations. She denies any physical complaints other than some chronic low back pain and states she has not taken her pain medication because she is concerned it may interfere with her thought process. The patient also states chronic low back pain with Neurontin. She states she has not taken Neurontin either. She denies any headaches blurred vision loss of vision or focal weakness. She is currently here voluntarily.   Past Medical History  Diagnosis Date  . Thyroid disease   . Grave's disease   . Ovarian cyst   . Acute kidney injury (HCC)   . Migraines   . Depression   . Anxiety   . Bipolar 1 disorder [facility](HCC)     Patient Active Problem List   Diagnosis Date Noted  . 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 Syncope 09/23/2014  . Hypokalemia 09/23/2014  . Chest pain 09/23/2014  . Acute kidney injury (HCC) 09/23/2014  . Diarrhea 09/23/2014  . Incomplete spontaneous abortion with pelvic infection 06/19/2013  . Threatened abortion, antepartum 05/31/2013  . Incomplete abortion complicated by genital tract and pelvic infection 05/10/2012    Past Surgical History  Procedure Laterality Date  . Thyroid surgery    . Fracture surgery    . Dilation and curettage of uterus    . Dilation and curettage of uterus  05/10/2012    Procedure: DILATATION AND CURETTAGE;  Surgeon: Tilda BurrowJohn V Ferguson, MD;  Location: [facility];  Service: Gynecology;  Laterality: N/A;  suction  . Iud removal  05/10/2012    Procedure: INTRAUTERINE DEVICE (IUD) REMOVAL;   Surgeon: Tilda BurrowJohn V Ferguson, MD;  Location: [facility];  Service: Gynecology;;  . Ankle surgery Left     pin put in  . Dilation and curettage of uterus N/A 06/19/2013    Procedure: DILATATION AND SUCTION CURETTAGE;  Surgeon: Tilda BurrowJohn V Ferguson, MD;  Location: [facility];  Service: Gynecology;  Laterality: N/A;    Past Surgical History  Procedure Laterality Date  . Thyroid surgery    . Fracture surgery    . Dilation and curettage of uterus    . Dilation and curettage of uterus  05/10/2012    Procedure: DILATATION AND CURETTAGE;  Surgeon: Tilda BurrowJohn V Ferguson, MD;  Location: [facility];  Service: Gynecology;  Laterality: N/A;  suction  . Iud removal  05/10/2012    Procedure: INTRAUTERINE DEVICE (IUD) REMOVAL;  Surgeon: Tilda BurrowJohn V Ferguson, MD;  Location: [facility];  Service: Gynecology;;  . Ankle surgery Left     pin put in  . Dilation and curettage of uterus N/A 06/19/2013    Procedure: DILATATION AND SUCTION CURETTAGE;  Surgeon: Tilda BurrowJohn V Ferguson, MD;  Location: [facility];  Service: Gynecology;  Laterality: N/A;    Current Outpatient Rx  Name  Route  Sig  Dispense  Refill  . cyclobenzaprine (FLEXERIL) 10 MG tablet   Oral   Take 1 tablet (10 mg total) by mouth 3 (three) times daily as needed for muscle spasms.   30 tablet   0   . ergocalciferol (DRISDOL)  50000 units capsule   Oral   Take 1 capsule by mouth every Friday.         . gabapentin (NEURONTIN) 600 MG tablet   Oral   Take 600 mg by mouth 3 (three) times daily.      0   . HYDROcodone-acetaminophen (NORCO) 10-325 MG tablet   Oral   Take 1 tablet by mouth 4 (four) times daily as needed.      0   . Levothyroxine Sodium (TIROSINT) 150 MCG CAPS   Oral   Take 1 capsule (150 mcg total) by mouth daily before breakfast.   30 capsule   6     Please dispense only Tirosint.   . medroxyPROGESTERone (DEPO-PROVERA) 150 MG/ML injection   Intramuscular   Inject 150 mg into the muscle every 3 (three) months.         . Multiple Vitamins-Minerals  (MULTIVITAMIN WITH MINERALS) tablet   Oral   Take 1 tablet by mouth daily.         . SUMAtriptan (IMITREX) 25 MG tablet      take one tablet by mouth as needed for migraine pain      3   . traMADol (ULTRAM) 50 MG tablet   Oral   Take 1 tablet (50 mg total) by mouth every 6 (six) hours as needed.   15 tablet   0     Allergies:  Aleve; Iodine; and Tylenol  Family History: Family History  Problem Relation Age of Onset  . Anesthesia problems Neg Hx   . Hypotension Neg Hx   . Malignant hyperthermia Neg Hx   . Pseudochol deficiency Neg Hx   . Diabetes Mother   . Hypertension Mother   . Hyperlipidemia Mother   . Cancer Mother   . Hypertension Father   . Hyperlipidemia Father   . Cancer Maternal Grandmother     uterine   . Heart disease Maternal Grandfather     Social History: Social History  Substance Use Topics  . Smoking status: Never Smoker   . Smokeless tobacco: Never Used  . Alcohol Use: Yes     Comment: social     Review of Systems:   10 point review of systems was performed and was otherwise negative:  Constitutional: No fever Eyes: No visual disturbances ENT: No sore throat, ear pain Cardiac: No chest pain Respiratory: No shortness of breath, wheezing, or stridor Abdomen: No abdominal pain, no vomiting, No diarrhea Endocrine: No weight loss, No night sweats Extremities: No peripheral edema, cyanosis Skin: No rashes, easy bruising Neurologic: No focal weakness, trouble with speech or swollowing Urologic: No dysuria, Hematuria, or urinary frequency   Physical Exam:  ED Triage Vitals  Enc Vitals Group     BP 06/13/16 2122 135/90 mmHg     Pulse Rate 06/13/16 2122 77     Resp 06/13/16 2122 18     Temp 06/13/16 2122 98.2 F (36.8 C)     Temp Source 06/13/16 2122 Oral     SpO2 06/13/16 2122 100 %     Weight 06/13/16 2122 198 lb (89.812 kg)     Height 06/13/16 2122  (1.702 m)     Head Cir --      Peak Flow --      Pain Score 06/13/16  2131 8     Pain Loc --      Pain Edu? --      Excl. in GC? --     General:  Awake , Alert , and Oriented times 3; GCS 15 Head: Normal cephalic , atraumatic Eyes: Pupils equal , round, reactive to light Nose/Throat: No nasal drainage, patent upper airway without erythema or exudate.  Neck: Supple, Full range of motion, No anterior adenopathy or palpable thyroid masses Lungs: Clear to ascultation without wheezes , rhonchi, or rales Heart: Regular rate, regular rhythm without murmurs , gallops , or rubs Abdomen: Soft, non tender without rebound, guarding , or rigidity; bowel sounds positive and symmetric in all 4 quadrants. No organomegaly .        Extremities: 2 plus symmetric pulses. No edema, clubbing or cyanosis Neurologic: normal ambulation, Motor symmetric without deficits, sensory intact Skin: warm, dry, no rashes   Labs:   All laboratory work was reviewed including any pertinent negatives or positives listed below:  Labs Reviewed  COMPREHENSIVE METABOLIC PANEL - Abnormal; Notable for the following:    Glucose, Bld 115 (*)    Calcium 8.8 (*)    ALT 10 (*)    All other components within normal limits  URINE DRUG SCREEN, QUALITATIVE ([facility] ONLY) - Abnormal; Notable for the following:    Cocaine Metabolite,Ur [place] POSITIVE (*)    All other components within normal limits  CBC  ETHANOL  SALICYLATE LEVEL  ACETAMINOPHEN LEVEL  POC URINE PREG, ED  Patient also received thyroid testing and her urine drug screen was positive for cocaine  EKG ED ECG REPORT I, Jennye MoccasinBrian S Quigley, the attending physician, personally viewed and interpreted this ECG.  Date: 06/13/2016 EKG Time: *2259 Rate: *64 Rhythm: normal sinus rhythm QRS Axis: normal Intervals: normal ST/T Wave abnormalities: Nonspecific ST-T wave abnormalities Conduction Disturbances: none Narrative Interpretation: unremarkable No acute ischemic change   ED Course:  Patient received psychiatric and TTS consultation. I most  likely will give her some Ativan for the positive cocaine. The patient's urine pregnancy test is negative. Given her presentation voluntarily, I did not fill out IVC paperwork. The patient was advised to let us know if she has any suicidal thoughts.  Assessment: * Suicidal ideation Illicit drug abuse     Plan: * Psychiatric consultation           Jennye MoccasinBrian S Quigley, MD 06/13/16 2232  Jennye MoccasinBrian S Quigley, MD 06/13/16 [phone]

## 2016-06-13 NOTE — ED Notes (Signed)

## 2016-06-13 NOTE — BH Assessment (Signed)
Assessment Note  Carolyn [name] is an 30 y.o. female. [name] arrived to the ED by personal transportation with her boyfriend.  She reports that today she was really depressed, feeling worthless and helpless.  She reports that she was staring off into space until about 2:00, When she states  "it was like a switch flipped on" and she was feeling fine for a while.  She states that she then became fidgety and restless. She reports that her mind is racing and she feels like she is going to "freak out".  She reports current symptoms of depression and continued feelings of hopelessness.  She reports having high anxiety levels.  She denied current hallucinations, but earlier she was hearing voices that were telling her she is worthless.  She denied suicidal or homicidal ideation or intent.  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  She report that her stepson passed away last July 18, 2023 and his birthday was on June 20[birth year].  She reports being unable to control her emotions.  Diagnosis: Depression  Past Medical History:  Past Medical History  Diagnosis Date  . Thyroid disease   . Grave's disease   . Ovarian cyst   . Acute kidney injury (HCC)   . Migraines   . Depression   . Anxiety   . Bipolar 1 disorder [facility])     Past Surgical History  Procedure Laterality Date  . Thyroid surgery    . Fracture surgery    . Dilation and curettage of uterus    . Dilation and curettage of uterus  05/10/2012    Procedure: DILATATION AND CURETTAGE;  Surgeon: Tilda Burrow, MD;  Location: [facility];  Service: Gynecology;  Laterality: N/A;  suction  . Iud removal  05/10/2012    Procedure: INTRAUTERINE DEVICE (IUD) REMOVAL;  Surgeon: Tilda Burrow, MD;  Location: [facility];  Service: Gynecology;;  . Ankle surgery Left     pin put in  . Dilation and curettage of uterus N/A 06/19/2013    Procedure: DILATATION AND SUCTION CURETTAGE;  Surgeon:  Tilda Burrow, MD;  Location: [facility];  Service: Gynecology;  Laterality: N/A;    Family History:  Family History  Problem Relation Age of Onset  . Anesthesia problems Neg Hx   . Hypotension Neg Hx   . Malignant hyperthermia Neg Hx   . Pseudochol deficiency Neg Hx   . Diabetes Mother   . Hypertension Mother   . Hyperlipidemia Mother   . Cancer Mother   . Hypertension Father   . Hyperlipidemia Father   . Cancer Maternal Grandmother     uterine   . Heart disease Maternal Grandfather     Social History:  reports that she has never smoked. She has never used smokeless tobacco. She reports that she drinks alcohol. She reports that she uses illicit drugs.  Additional Social History:  Alcohol / Drug Use History of alcohol / drug use?: Yes (use of cocaine once after 3 years clean)  CIWA: CIWA-Ar BP: 135/90 mmHg Pulse Rate: 77 COWS:    Allergies:  Allergies  Allergen Reactions  . Aleve [Naproxen Sodium] Nausea And Vomiting    'makes headaches worse"  . Iodine Other (See Comments)    Causes blackouts  . Tylenol [Acetaminophen] Nausea And Vomiting    Home Medications:  (Not in a hospital admission)  OB/GYN Status:  No LMP recorded. Patient is  not currently having periods (Reason: Other).  General Assessment Data [facility] Assessment: In system Is this a Tele or Face-to-Face Assessment?: Face-to-Face Is this an Initial Assessment or a Re-assessment for this encounter?: Initial Assessment Marital status: Separated Maiden name: Carolyn [name] Is patient pregnant?: No Pregnancy Status: No Living Arrangements: Spouse/significant other (boyfriend) Can pt return to current living arrangement?: Yes Admission Status: Voluntary Is patient capable of signing voluntary admission?: Yes Referral Source: Self/Family/Friend Insurance type: Medicaid  Medical Screening Exam [facility] Walk-in ONLY) Medical Exam completed: Yes  Crisis Care Plan Living Arrangements:  Spouse/significant other (boyfriend) Legal Guardian: Other: (Self) Name of Psychiatrist: None at this time Name of Therapist: None at this time  Education Status Is patient currently in school?: No Current Grade: n/a Highest grade of school patient has completed: 12th Name of school: [employer] Contact person: n/a  Risk to self with the past 6 months Suicidal Ideation: No Has patient been a risk to self within the past 6 months prior to admission? : No Suicidal Intent: No-Not Currently/Within Last 6 Months Has patient had any suicidal intent within the past 6 months prior to admission? : No Is patient at risk for suicide?: No Suicidal Plan?: No Has patient had any suicidal plan within the past 6 months prior to admission? : No Access to Means: No What has been your use of drugs/alcohol within the last 12 months?: history of cocaine usage Previous Attempts/Gestures: No How many times?: 0 Other Self Harm Risks: denied Triggers for Past Attempts: None known Intentional Self Injurious Behavior: None Family Suicide History: No Recent stressful life event(s): Other (Comment) (stepson's death) Persecutory voices/beliefs?: Yes Depression: Yes Depression Symptoms: Despondent, Feeling worthless/self pity Substance abuse history and/or treatment for substance abuse?: Yes Suicide prevention information given to non-admitted patients: Not applicable  Risk to Others within the past 6 months Homicidal Ideation: No Does patient have any lifetime risk of violence toward others beyond the six months prior to admission? : No Thoughts of Harm to Others: No Current Homicidal Intent: No Current Homicidal Plan: No Access to Homicidal Means: No Identified Victim: None identified History of harm to others?: No Assessment of Violence: None Noted Violent Behavior Description: denied Does patient have access to weapons?: No Criminal Charges Pending?: No (Driving while license is revoked) Does  patient have a court date: Yes Court Date: 06/18/16 (10/13/2016) Is patient on probation?: No  Psychosis Hallucinations: None noted Delusions: None noted  Mental Status Report Appearance/Hygiene: In scrubs, Unremarkable Eye Contact: Poor Motor Activity: Agitation Speech: Unremarkable Level of Consciousness: Alert Mood: Depressed Affect: Depressed Anxiety Level: Moderate Thought Processes: Coherent Judgement: Unimpaired Orientation: Place, Person, Situation, Time Obsessive Compulsive Thoughts/Behaviors: None  Cognitive Functioning Concentration: Decreased Memory: Recent Intact IQ: Average Insight: Fair Impulse Control: Fair Appetite: Fair Sleep: Unable to Assess (Random sleeping patterns) Vegetative Symptoms: None  ADLScreening [facility] Assessment Services) Patient's cognitive ability adequate to safely complete daily activities?: Yes Patient able to express need for assistance with ADLs?: Yes Independently performs ADLs?: Yes (appropriate for developmental age)  Prior Inpatient Therapy Prior Inpatient Therapy: Yes Prior Therapy Dates: 2014 [facility] Reason for Treatment: Depression  Prior Outpatient Therapy Prior Outpatient Therapy: Yes Prior Therapy Dates: 2014 Prior Therapy Facilty/Provider(s): unsure Reason for Treatment: Depression Does patient have an ACCT team?: No Does patient have Intensive In-House Services?  : No Does patient have [facility] services? : No Does patient have P4CC services?: No  ADL Screening (condition at time of admission)  Patient's cognitive ability adequate to safely complete daily activities?: Yes Patient able to express need for assistance with ADLs?: Yes Independently performs ADLs?: Yes (appropriate for developmental age)       Abuse/Neglect Assessment (Assessment to be complete while patient is alone) Physical Abuse: Denies Verbal Abuse: Denies Sexual Abuse: Denies Exploitation of  patient/patient's resources: Denies Self-Neglect: Denies Values / Beliefs Cultural Requests During Hospitalization: None   Advance Directives (For Healthcare) Does patient have an advance directive?: No    Additional Information 1:1 In Past 12 Months?: No CIRT Risk: No Elopement Risk: No Does patient have medical clearance?: Yes     Disposition:  Disposition Initial Assessment Completed for this Encounter: Yes Disposition of Patient: Other dispositions  On Site Evaluation by:   Reviewed with Physician:    Justice DeedsKeisha Shulamis Wenberg 06/13/2016 10:54 PM

## 2016-06-14 ENCOUNTER — Emergency Department
Admission: EM | Admit: 2016-06-14 | Discharge: 2016-06-14 | Discharge status: Absent without leave | Payer: Medicaid Other

## 2016-06-14 ENCOUNTER — Inpatient Hospital Stay
Admission: AD | Admit: 2016-06-14 | Discharge: 2016-06-17 | DRG: 885 | Disposition: A | Discharge status: Home / Self Care | Payer: Medicaid Other | Source: Intra-hospital | Attending: Psychiatry | Admitting: Psychiatry

## 2016-06-14 ENCOUNTER — Encounter: Payer: Self-pay | Admitting: *Deleted

## 2016-06-14 DIAGNOSIS — E559 Vitamin D deficiency, unspecified: Secondary | ICD-10-CM | POA: Diagnosis present

## 2016-06-14 DIAGNOSIS — Z888 Allergy status to other drugs, medicaments and biological substances status: Secondary | ICD-10-CM

## 2016-06-14 DIAGNOSIS — Z809 Family history of malignant neoplasm, unspecified: Secondary | ICD-10-CM

## 2016-06-14 DIAGNOSIS — Z79899 Other long term (current) drug therapy: Secondary | ICD-10-CM | POA: Diagnosis not present

## 2016-06-14 DIAGNOSIS — F429 Obsessive-compulsive disorder, unspecified: Secondary | ICD-10-CM | POA: Diagnosis present

## 2016-06-14 DIAGNOSIS — F333 Major depressive disorder, recurrent, severe with psychotic symptoms: Secondary | ICD-10-CM | POA: Diagnosis not present

## 2016-06-14 DIAGNOSIS — F4001 Agoraphobia with panic disorder: Secondary | ICD-10-CM | POA: Diagnosis present

## 2016-06-14 DIAGNOSIS — R45851 Suicidal ideations: Secondary | ICD-10-CM

## 2016-06-14 DIAGNOSIS — F4310 Post-traumatic stress disorder, unspecified: Secondary | ICD-10-CM | POA: Diagnosis present

## 2016-06-14 DIAGNOSIS — Z8249 Family history of ischemic heart disease and other diseases of the circulatory system: Secondary | ICD-10-CM

## 2016-06-14 DIAGNOSIS — Z9889 Other specified postprocedural states: Secondary | ICD-10-CM

## 2016-06-14 DIAGNOSIS — F1420 Cocaine dependence, uncomplicated: Secondary | ICD-10-CM | POA: Diagnosis present

## 2016-06-14 DIAGNOSIS — G4700 Insomnia, unspecified: Secondary | ICD-10-CM | POA: Diagnosis present

## 2016-06-14 DIAGNOSIS — E039 Hypothyroidism, unspecified: Secondary | ICD-10-CM | POA: Diagnosis present

## 2016-06-14 DIAGNOSIS — Z833 Family history of diabetes mellitus: Secondary | ICD-10-CM | POA: Diagnosis not present

## 2016-06-14 DIAGNOSIS — Z9119 Patient's noncompliance with other medical treatment and regimen: Secondary | ICD-10-CM

## 2016-06-14 DIAGNOSIS — M549 Dorsalgia, unspecified: Secondary | ICD-10-CM | POA: Diagnosis present

## 2016-06-14 LAB — HEMOGLOBIN A1C: Hgb A1c MFr Bld: 5.4 % (ref 4.0–6.0)

## 2016-06-14 MED ORDER — ADULT MULTIVITAMIN W/MINERALS CH
1.0000 | ORAL_TABLET | Freq: Every day | ORAL | Status: DC
  Administered 2016-06-14: 1 via ORAL
  Filled 2016-06-14: qty 1

## 2016-06-14 MED ORDER — ONDANSETRON HCL 4 MG PO TABS: 4.0000 mg | ORAL_TABLET | Freq: Four times a day (QID) | ORAL | Status: DC | PRN

## 2016-06-14 MED ORDER — ENOXAPARIN SODIUM 40 MG/0.4ML ~~LOC~~ SOLN: 40.0000 mg | SUBCUTANEOUS | Status: DC

## 2016-06-14 MED ORDER — VITAMIN D (ERGOCALCIFEROL) 1.25 MG (50000 UNIT) PO CAPS: 50000.0000 [IU] | ORAL_CAPSULE | ORAL | Status: DC

## 2016-06-14 MED ORDER — MORPHINE SULFATE (PF) 2 MG/ML IV SOLN
2.0000 mg | INTRAVENOUS | Status: DC | PRN
Start: 2016-06-14 — End: 2016-06-14

## 2016-06-14 MED ORDER — ERGOCALCIFEROL 1.25 MG (50000 UT) PO CAPS
50000.0000 [IU] | ORAL_CAPSULE | ORAL | Status: DC
  Administered 2016-06-14: 50000 [IU] via ORAL
  Filled 2016-06-14: qty 1

## 2016-06-14 MED ORDER — GABAPENTIN 600 MG PO TABS
600.0000 mg | ORAL_TABLET | Freq: Three times a day (TID) | ORAL | Status: DC
  Administered 2016-06-14 (×2): 600 mg via ORAL
  Filled 2016-06-14 (×2): qty 1

## 2016-06-14 MED ORDER — LEVOTHYROXINE SODIUM 150 MCG PO CAPS: 150.0000 ug | ORAL_CAPSULE | Freq: Every day | ORAL | Status: DC

## 2016-06-14 MED ORDER — LEVOTHYROXINE SODIUM 150 MCG PO TABS
150.0000 ug | ORAL_TABLET | Freq: Every day | ORAL | Status: DC
  Administered 2016-06-14: 150 ug via ORAL
  Filled 2016-06-14: qty 1

## 2016-06-14 MED ORDER — VENLAFAXINE HCL ER 75 MG PO CP24
150.0000 mg | ORAL_CAPSULE | Freq: Every day | ORAL | Status: DC
  Administered 2016-06-15 – 2016-06-17 (×3): 150 mg via ORAL
  Filled 2016-06-14 (×3): qty 2

## 2016-06-14 MED ORDER — MAGNESIUM HYDROXIDE 400 MG/5ML PO SUSP: 30.0000 mL | Freq: Every day | ORAL | Status: DC | PRN

## 2016-06-14 MED ORDER — GABAPENTIN 600 MG PO TABS
600.0000 mg | ORAL_TABLET | Freq: Three times a day (TID) | ORAL | Status: DC
  Administered 2016-06-14 – 2016-06-16 (×5): 600 mg via ORAL
  Filled 2016-06-14 (×8): qty 1

## 2016-06-14 MED ORDER — ONDANSETRON HCL 4 MG/2ML IJ SOLN: 4.0000 mg | Freq: Four times a day (QID) | INTRAMUSCULAR | Status: DC | PRN

## 2016-06-14 MED ORDER — CYCLOBENZAPRINE HCL 10 MG PO TABS: 10.0000 mg | ORAL_TABLET | Freq: Three times a day (TID) | ORAL | Status: DC | PRN

## 2016-06-14 MED ORDER — ACETAMINOPHEN 650 MG RE SUPP: 650.0000 mg | Freq: Four times a day (QID) | RECTAL | Status: DC | PRN

## 2016-06-14 MED ORDER — ACETAMINOPHEN 325 MG PO TABS
650.0000 mg | ORAL_TABLET | Freq: Four times a day (QID) | ORAL | Status: DC | PRN
  Administered 2016-06-17: 650 mg via ORAL
  Filled 2016-06-14: qty 2

## 2016-06-14 MED ORDER — ACETAMINOPHEN 325 MG PO TABS: 650.0000 mg | ORAL_TABLET | Freq: Four times a day (QID) | ORAL | Status: DC | PRN

## 2016-06-14 MED ORDER — LEVOTHYROXINE SODIUM 150 MCG PO TABS
150.0000 ug | ORAL_TABLET | Freq: Every day | ORAL | Status: DC
  Administered 2016-06-15 – 2016-06-17 (×3): 150 ug via ORAL
  Filled 2016-06-14 (×6): qty 1

## 2016-06-14 MED ORDER — LORAZEPAM 2 MG PO TABS
2.0000 mg | ORAL_TABLET | ORAL | Status: DC | PRN
  Administered 2016-06-14: 2 mg via ORAL
  Filled 2016-06-14: qty 1

## 2016-06-14 MED ORDER — RISPERIDONE 1 MG PO TABS
2.0000 mg | ORAL_TABLET | Freq: Two times a day (BID) | ORAL | Status: DC
  Administered 2016-06-14 – 2016-06-17 (×6): 2 mg via ORAL
  Filled 2016-06-14 (×6): qty 2

## 2016-06-14 MED ORDER — ADULT MULTIVITAMIN W/MINERALS CH
1.0000 | ORAL_TABLET | Freq: Every day | ORAL | Status: DC
  Administered 2016-06-15 – 2016-06-17 (×3): 1 via ORAL
  Filled 2016-06-14 (×3): qty 1

## 2016-06-14 MED ORDER — LEVOTHYROXINE SODIUM 25 MCG PO TABS
25.0000 ug | ORAL_TABLET | Freq: Every day | ORAL | Status: DC
  Administered 2016-06-14: 25 ug via ORAL
  Filled 2016-06-14: qty 0.5

## 2016-06-14 MED ORDER — DOCUSATE SODIUM 100 MG PO CAPS
100.0000 mg | ORAL_CAPSULE | Freq: Two times a day (BID) | ORAL | Status: DC
  Administered 2016-06-14: 100 mg via ORAL
  Filled 2016-06-14: qty 1

## 2016-06-14 MED ORDER — TRAZODONE HCL 100 MG PO TABS
100.0000 mg | ORAL_TABLET | Freq: Every evening | ORAL | Status: DC | PRN
  Administered 2016-06-14: 100 mg via ORAL
  Filled 2016-06-14 (×2): qty 1

## 2016-06-14 MED ORDER — ALUM & MAG HYDROXIDE-SIMETH 200-200-20 MG/5ML PO SUSP: 30.0000 mL | ORAL | Status: DC | PRN

## 2016-06-14 MED ORDER — HYDROCODONE-ACETAMINOPHEN 10-325 MG PO TABS
1.0000 | ORAL_TABLET | Freq: Four times a day (QID) | ORAL | Status: DC | PRN
  Administered 2016-06-14 (×2): 1 via ORAL
  Filled 2016-06-14 (×2): qty 1

## 2016-06-14 MED ORDER — DOCUSATE SODIUM 100 MG PO CAPS
100.0000 mg | ORAL_CAPSULE | Freq: Two times a day (BID) | ORAL | Status: DC
  Administered 2016-06-14 – 2016-06-17 (×6): 100 mg via ORAL
  Filled 2016-06-14 (×6): qty 1

## 2016-06-14 NOTE — H&P (Signed)
Carolyn [name] is an 30 y.o. female.    Chief Complaint: Suicidal ideation HPI: The patient with past medical history of depression and hypothyroidism presents emergency department reporting suicidal ideas. The patient states that her depression is worsened by her chronic lower back pain. The patient denies complaints anywhere else. She states that she has not been taking her pain medication as she has recognized her worsening depression and suicidal ideas. In the emergency department laboratory evaluation revealed significantly elevated TSH. Due to her medical condition psychiatric services recommended stabilization prior to admission to the behavioral health unit. Thus the hospitalist service was called for admission.  Past Medical History  Diagnosis Date  . Thyroid disease   . Grave's disease   . Ovarian cyst   . Acute kidney injury ([place])   . Migraines   . Depression   . Anxiety   . Bipolar 1 disorder [facility])     Past Surgical History  Procedure Laterality Date  . Thyroid surgery    . Fracture surgery    . Dilation and curettage of uterus    . Dilation and curettage of uterus  05/10/2012    Procedure: DILATATION AND CURETTAGE;  Surgeon: Jonnie Kind, MD;  Location: [facility];  Service: Gynecology;  Laterality: N/A;  suction  . Iud removal  05/10/2012    Procedure: INTRAUTERINE DEVICE (IUD) REMOVAL;  Surgeon: Jonnie Kind, MD;  Location: [facility];  Service: Gynecology;;  . Ankle surgery Left     pin put in  . Dilation and curettage of uterus N/A 06/19/2013    Procedure: DILATATION AND SUCTION CURETTAGE;  Surgeon: Jonnie Kind, MD;  Location: [facility];  Service: Gynecology;  Laterality: N/A;    Family History  Problem Relation Age of Onset  . Anesthesia problems Neg Hx   . Hypotension Neg Hx   . Malignant hyperthermia Neg Hx   . Pseudochol deficiency Neg Hx   . Diabetes Mother   . Hypertension Mother   . Hyperlipidemia Mother   . Cancer Mother   . Hypertension Father   .  Hyperlipidemia Father   . Cancer Maternal Grandmother     uterine   . Heart disease Maternal Grandfather    Social History:  reports that she has never smoked. She has never used smokeless tobacco. She reports that she drinks alcohol. She reports that she uses illicit drugs.  Allergies:  Allergies  Allergen Reactions  . Aleve [Naproxen Sodium] Nausea And Vomiting    'makes headaches worse"  . Iodine Other (See Comments)    Causes blackouts  . Tylenol [Acetaminophen] Nausea And Vomiting    Medications Prior to Admission  Medication Sig Dispense Refill  . cyclobenzaprine (FLEXERIL) 10 MG tablet Take 10 mg by mouth 3 (three) times daily as needed for muscle spasms.    . ergocalciferol (DRISDOL) 50000 units capsule Take 1 capsule by mouth once a week. Patient takes on Monday    . gabapentin (NEURONTIN) 600 MG tablet Take 600 mg by mouth 3 (three) times daily.  0  . HYDROcodone-acetaminophen (NORCO) 10-325 MG tablet Take 1 tablet by mouth 4 (four) times daily as needed for moderate pain.   0  . Levothyroxine Sodium (TIROSINT) 150 MCG CAPS Take 1 capsule (150 mcg total) by mouth daily before breakfast. 30 capsule 6  . Multiple Vitamins-Minerals (MULTIVITAMIN WITH MINERALS) tablet Take 1 tablet by mouth daily.      Results for orders placed or performed during the hospital encounter of 06/13/16 (  from the past 48 hour(s))  Comprehensive metabolic panel     Status: Abnormal   Collection Time: 06/13/16  9:56 PM  Result Value Ref Range   Sodium 137 135 - 145 mmol/L   Potassium 3.5 3.5 - 5.1 mmol/L   Chloride 103 101 - 111 mmol/L   CO2 24 22 - 32 mmol/L   Glucose, Bld 115 (H) 65 - 99 mg/dL   BUN 13 6 - 20 mg/dL   Creatinine, Ser 1.00 0.44 - 1.00 mg/dL   Calcium 8.8 (L) 8.9 - 10.3 mg/dL   Total Protein 7.8 6.5 - 8.1 g/dL   Albumin 4.7 3.5 - 5.0 g/dL   AST 21 15 - 41 U/L   ALT 10 (L) 14 - 54 U/L   Alkaline Phosphatase 64 38 - 126 U/L   Total Bilirubin 0.7 0.3 - 1.2 mg/dL   GFR calc non  Af Amer >60 >60 mL/min   GFR calc Af Amer >60 >60 mL/min    Comment: (NOTE) The eGFR has been calculated using the CKD EPI equation. This calculation has not been validated in all clinical situations. eGFR's persistently <60 mL/min signify possible Chronic Kidney Disease.    Anion gap 10 5 - 15  Ethanol     Status: None   Collection Time: 06/13/16  9:56 PM  Result Value Ref Range   Alcohol, Ethyl (B) <5 <5 mg/dL    Comment:        LOWEST DETECTABLE LIMIT FOR SERUM ALCOHOL IS 5 mg/dL FOR MEDICAL PURPOSES ONLY   Salicylate level     Status: None   Collection Time: 06/13/16  9:56 PM  Result Value Ref Range   Salicylate Lvl <7.6 2.8 - 30.0 mg/dL  Acetaminophen level     Status: Abnormal   Collection Time: 06/13/16  9:56 PM  Result Value Ref Range   Acetaminophen (Tylenol), Serum <10 (L) 10 - 30 ug/mL    Comment:        THERAPEUTIC CONCENTRATIONS VARY SIGNIFICANTLY. A RANGE OF 10-30 ug/mL MAY BE AN EFFECTIVE CONCENTRATION FOR MANY PATIENTS. HOWEVER, SOME ARE BEST TREATED AT CONCENTRATIONS OUTSIDE THIS RANGE. ACETAMINOPHEN CONCENTRATIONS >150 ug/mL AT 4 HOURS AFTER INGESTION AND >50 ug/mL AT 12 HOURS AFTER INGESTION ARE OFTEN ASSOCIATED WITH TOXIC REACTIONS.   cbc     Status: None   Collection Time: 06/13/16  9:56 PM  Result Value Ref Range   WBC 9.7 3.6 - 11.0 K/uL   RBC 4.73 3.80 - 5.20 MIL/uL   Hemoglobin 13.9 12.0 - 16.0 g/dL   HCT 39.5 35.0 - 47.0 %   MCV 83.5 80.0 - 100.0 fL   MCH 29.3 26.0 - 34.0 pg   MCHC 35.1 32.0 - 36.0 g/dL   RDW 14.3 11.5 - 14.5 %   Platelets 204 150 - 440 K/uL  TSH     Status: Abnormal   Collection Time: 06/13/16  9:56 PM  Result Value Ref Range   TSH 60.474 (H) 0.350 - 4.500 uIU/mL  Urine Drug Screen, Qualitative     Status: Abnormal   Collection Time: 06/13/16 10:03 PM  Result Value Ref Range   Tricyclic, Ur Screen NONE DETECTED NONE DETECTED   Amphetamines, Ur Screen NONE DETECTED NONE DETECTED   MDMA (Ecstasy)Ur Screen NONE  DETECTED NONE DETECTED   Cocaine Metabolite,Ur [place] POSITIVE (A) NONE DETECTED   Opiate, Ur Screen NONE DETECTED NONE DETECTED   Phencyclidine (PCP) Ur S NONE DETECTED NONE DETECTED   Cannabinoid 50 Ng, Ur [place]  NONE DETECTED NONE DETECTED   Barbiturates, Ur Screen NONE DETECTED NONE DETECTED   Benzodiazepine, Ur Scrn NONE DETECTED NONE DETECTED   Methadone Scn, Ur NONE DETECTED NONE DETECTED    Comment: (NOTE) 161  Tricyclics, urine               Cutoff 1000 ng/mL 200  Amphetamines, urine             Cutoff 1000 ng/mL 300  MDMA (Ecstasy), urine           Cutoff 500 ng/mL 400  Cocaine Metabolite, urine       Cutoff 300 ng/mL 500  Opiate, urine                   Cutoff 300 ng/mL 600  Phencyclidine (PCP), urine      Cutoff 25 ng/mL 700  Cannabinoid, urine              Cutoff 50 ng/mL 800  Barbiturates, urine             Cutoff 200 ng/mL 900  Benzodiazepine, urine           Cutoff 200 ng/mL 1000 Methadone, urine                Cutoff 300 ng/mL 1100 1200 The urine drug screen provides only a preliminary, unconfirmed 1300 analytical test result and should not be used for non-medical 1400 purposes. Clinical consideration and professional judgment should 1500 be applied to any positive drug screen result due to possible 1600 interfering substances. A more specific alternate chemical method 1700 must be used in order to obtain a confirmed analytical result.  1800 Gas chromato graphy / mass spectrometry (GC/MS) is the preferred 1900 confirmatory method.    No results found.  Review of Systems  Constitutional: Negative for fever and chills.  HENT: Negative for sore throat and tinnitus.   Eyes: Negative for blurred vision and redness.  Respiratory: Negative for cough and shortness of breath.   Cardiovascular: Negative for chest pain, palpitations, orthopnea and PND.  Gastrointestinal: Negative for nausea, vomiting, abdominal pain and diarrhea.  Genitourinary: Negative for dysuria, urgency and  frequency.  Musculoskeletal: Negative for myalgias and joint pain.  Skin: Negative for rash.       No lesions  Neurological: Negative for speech change, focal weakness and weakness.  Endo/Heme/Allergies: Does not bruise/bleed easily.       No temperature intolerance  Psychiatric/Behavioral: Positive for depression. Negative for suicidal ideas (Not currently).    Blood pressure 125/94, pulse 79, temperature 98.5 F (36.9 C), temperature source Oral, resp. rate 18, height _0  (1.702 m), weight 93.214 kg (205 lb 8 oz), SpO2 100 %. Physical Exam  Vitals reviewed. Constitutional: She is oriented to person, place, and time. She appears well-developed and well-nourished.  HENT:  Head: Normocephalic and atraumatic.  Mouth/Throat: Oropharynx is clear and moist.  Eyes: Conjunctivae and EOM are normal. Pupils are equal, round, and reactive to light. No scleral icterus.  Neck: Normal range of motion. Neck supple. No JVD present. No tracheal deviation present. No thyromegaly present.  Cardiovascular: Normal rate, regular rhythm and normal heart sounds.  Exam reveals no gallop and no friction rub.   No murmur heard. Respiratory: Effort normal and breath sounds normal.  GI: Soft. Bowel sounds are normal. She exhibits no distension. There is no tenderness.  Genitourinary:  Deferred  Musculoskeletal: Normal range of motion. She exhibits no edema.  Lymphadenopathy:    She  has no cervical adenopathy.  Neurological: She is alert and oriented to person, place, and time. No cranial nerve deficit. She exhibits normal muscle tone.  Skin: Skin is warm and dry. No rash noted. No erythema.  Psychiatric: She has a normal mood and affect. Her behavior is normal. Judgment and thought content normal.     Assessment/Plan This is a 30 year old female admitted for severe hypothyroidism. 1. Hypothyroidism: TSH 65; resume home dose of Synthroid. The patient has no myxedema thus no IV thyroid replacement at this  time. Consider endocrine consult as the patient reports difficulty maintaining T4 levels due to formulation of her medicine (reports responding better to capsule form of Synthroid then tablet form). Differential diagnosis also includes noncompliance. 2. Depression: The patient reports that her primary psychiatrist has stopped her Prozac. We will defer to psychiatry input regarding restart of SSRIs in the setting of suicidal ideation. The patient states that she does not have a plan nor is she currently suicidal. 3. DVT prophylaxis: Lovenox 4. GI prophylaxis: None The patient is a full code. Time spent on admission orders and patient care approximately 45 minutes  Harrie Foreman, MD 06/14/2016, 6:03 AM

## 2016-06-14 NOTE — ED Notes (Signed)
BEHAVIORAL HEALTH ROUNDING  Patient sleeping: No.  Patient alert and oriented: yes  Behavior appropriate: Yes. ; If no, describe:  Nutrition and fluids offered: Yes  Toileting and hygiene offered: Yes  Sitter present: not applicable, Q 15 min safety rounds and observation.  Law enforcement present: Yes ODS  

## 2016-06-14 NOTE — Tx Team (Signed)
Initial Interdisciplinary Treatment Plan   PATIENT STRESSORS: Health problems Loss of stepson 3 years ago Substance abuse   PATIENT STRENGTHS: Average or above average intelligence Capable of independent living Communication skills Motivation for treatment/growth Supportive family/friends Work skills   PROBLEM LIST: Problem List/Patient Goals Date to be addressed Date deferred Reason deferred Estimated date of resolution  Major Depressive disorder      Substance Abuse      Suicidal thoughts      Auditory hallucinations                                     DISCHARGE CRITERIA:  Improved stabilization in mood, thinking, and/or behavior  PRELIMINARY DISCHARGE PLAN: Outpatient therapy  PATIENT/FAMIILY INVOLVEMENT: This treatment plan has been presented to and reviewed with the patient, Charlott RakesLeah R Urania, and/or family member,   The patient and family have been given the opportunity to ask questions and make suggestions.  Shelia MediaJones, [place] Skipper 06/14/2016, 6:13 PM

## 2016-06-14 NOTE — Progress Notes (Addendum)
[facility] Dr. Ardyth HarpsHernandez.  Attending Physician will be Dr. Jennet MaduroPucilowska.   Patient [facility] room 306[facility] Marylu LundJanet.   Intake Paper Work has been signed and placed on patient chart.  ER staff is aware of the admission Burnis Kingfisher( [facility]). Please call report to [phone] Time of admission pending Wynette [place] K. Sherlon HandingHarris, LCAS-A, LPC-A, [facility]  Counselor 06/14/2016 3:26 PM

## 2016-06-14 NOTE — ED Notes (Signed)
BEHAVIORAL HEALTH ROUNDING  Patient sleeping: No.  Patient alert and oriented: yes  Behavior appropriate: Yes. ; If no, describe:  Nutrition and fluids offered: Yes  Toileting and hygiene offered: Yes  Sitter present: not applicable, Q 15 min safety rounds and observation.  Law enforcement present: Yes ODS  Pt Calm and cooperative, very pleasant. Understanding of admission and plan of care.

## 2016-06-14 NOTE — Consult Note (Signed)
[facility] Consult   Reason for Consult:  To evaluate depressed patient. Referring Physician:  Dr. Vianne Bulls Patient Identification: Carolyn [name] MRN:  [id number] Principal Diagnosis: Major depressive disorder, recurrent, severe with psychotic features [facility]) Diagnosis:   Patient Active Problem List   Diagnosis Date Noted  . Hypothyroidism [E03.9] 06/14/2016  . Major depressive disorder, recurrent, severe with psychotic features ([place]) [F33.3] 06/14/2016  . PTSD (post-traumatic stress disorder) [F43.10] 06/14/2016  . OCD (obsessive compulsive disorder) [F42.9] 06/14/2016  . 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 Syncope [R55] 09/23/2014  . Hypokalemia [E87.6] 09/23/2014  . Chest pain [R07.9] 09/23/2014  . Acute kidney injury ([place]) [N17.9] 09/23/2014  . Diarrhea [R19.7] 09/23/2014  . Incomplete spontaneous abortion with pelvic infection [O03.0] 06/19/2013  . Threatened abortion, antepartum [O20.0] 05/31/2013  . Incomplete abortion complicated by genital tract and pelvic infection [O03.0] 05/10/2012    Total Time spent with patient: 1 hour  Subjective:    Identifying data. Carolyn [name] is a 30 y.o. female with a history of depression, anxiety, psychosis, mood instability and substance use.  Chief complaint. "I think I might give in to hallucinations."  History of present illness. Information was obtained from the patient and the chart. The patient has a long history of depression and mood instability with several prior hospitalizations. She came to the hospital complaining of mood instability, auditory command hallucination and suicidal ideation. She was briefly hospitalized on medical floor for severe hypothyroidism. She has been off her psychiatric medications for several years now. In the past couple of months became increasingly depressed. She believes that this is in  association with the anniversary of losing her stepson died in an accident. It also coincided with her son's birthday. Upcoming for life quality made it even worse. The patient reports many symptoms of depression with poor sleep, decreased appetite, poor energy and concentration, social isolation, crying spells. She also developed hallucinations commanding her to hurt herself. She has no intention to attempt suicide but is tired of the voices and worried that she may give in. She reports profound mood swings with periods of depressed mood extremely low energy and high anxiety. They are interspace with brief periods of hyperactivity, insomnia, irritability. She reports many symptoms of depression with frequent panic attacks, social anxiety, PTSD symptoms stemming from losing her stepson with nightmares and flashbacks and OCD symptoms. She denies alcohol or illicit substance use but is positive for cocaine.  Past psychiatric history. She had 2 prior psychiatric hospitalizations. There were no suicide attempts. She has not been under psychiatric care for several years.  Family psychiatric history. Her father has severe anxiety and schizoaffective disorder. He is now on medications during better.  Social history. She lives with her boyfriend and 3 children ages 42, 72 and 27. 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 She has a history of severe hypothyroidism that is difficult to control  Risk to Self: Suicidal Ideation: No Suicidal Intent: No-Not Currently/Within Last 6 Months Is patient at risk for suicide?: No Suicidal Plan?: No Access to Means: No What has been your use of drugs/alcohol within the last 12 months?: history of cocaine usage How many times?: 0 Other Self Harm Risks: denied Triggers for Past Attempts: None known Intentional Self Injurious Behavior: None Risk to Others: Homicidal Ideation: No Thoughts of Harm to Others: No Current Homicidal Intent: No Current Homicidal Plan: No Access to Homicidal  Means: No Identified Victim:  None identified History of harm to others?: No Assessment of Violence: None Noted Violent Behavior Description: denied Does patient have access to weapons?: No Criminal Charges Pending?: No (Driving while license is revoked) Does patient have a court date: Yes Court Date: 06/18/16 (10/13/2016) Prior Inpatient Therapy: Prior Inpatient Therapy: Yes Prior Therapy Dates: 2014 Prior Therapy Facilty/Provider(s): [facility] Reason for Treatment: Depression Prior Outpatient Therapy: Prior Outpatient Therapy: Yes Prior Therapy Dates: 2014 Prior Therapy Facilty/Provider(s): unsure Reason for Treatment: Depression Does patient have an ACCT team?: No Does patient have Intensive In-House Services?  : No Does patient have [facility] services? : No Does patient have P4CC services?: No  Past Medical History:  Past Medical History  Diagnosis Date  . Thyroid disease   . Grave's disease   . Ovarian cyst   . Acute kidney injury ([place])   . Migraines   . Depression   . Anxiety   . Bipolar 1 disorder [facility])     Past Surgical History  Procedure Laterality Date  . Thyroid surgery    . Fracture surgery    . Dilation and curettage of uterus    . Dilation and curettage of uterus  05/10/2012    Procedure: DILATATION AND CURETTAGE;  Surgeon: Jonnie Kind, MD;  Location: [facility];  Service: Gynecology;  Laterality: N/A;  suction  . Iud removal  05/10/2012    Procedure: INTRAUTERINE DEVICE (IUD) REMOVAL;  Surgeon: Jonnie Kind, MD;  Location: [facility];  Service: Gynecology;;  . Ankle surgery Left     pin put in  . Dilation and curettage of uterus N/A 06/19/2013    Procedure: DILATATION AND SUCTION CURETTAGE;  Surgeon: Jonnie Kind, MD;  Location: [facility];  Service: Gynecology;  Laterality: N/A;   Family History:  Family History  Problem Relation Age of Onset  . Anesthesia problems Neg Hx   . Hypotension Neg Hx   . Malignant hyperthermia Neg Hx   . Pseudochol deficiency Neg Hx    . Diabetes Mother   . Hypertension Mother   . Hyperlipidemia Mother   . Cancer Mother   . Hypertension Father   . Hyperlipidemia Father   . Cancer Maternal Grandmother     uterine   . Heart disease Maternal Grandfather     Social History:  History  Alcohol Use  . Yes    Comment: social     History  Drug Use  . Yes    Comment: used crack this past thursday    Social History   Social History  . Marital Status: Legally Separated    Spouse Name: N/A  . Number of Children: N/A  . Years of Education: N/A   Social History Main Topics  . Smoking status: Never Smoker   . Smokeless tobacco: Never Used  . Alcohol Use: Yes     Comment: social  . Drug Use: Yes     Comment: used crack this past thursday  . Sexual Activity: Yes    Birth Control/ Protection: Injection   Other Topics Concern  . None   Social History Narrative   Additional Social History:    Allergies:   Allergies  Allergen Reactions  . Aleve [Naproxen Sodium] Nausea And Vomiting    'makes headaches worse"  . Iodine Other (See Comments)    Causes blackouts  . Tylenol [Acetaminophen] Nausea And Vomiting    Labs:  Results for orders placed or performed during the hospital encounter of 06/13/16 (from the past 48 hour(s))  Comprehensive metabolic panel     Status: Abnormal   Collection Time: 06/13/16  9:56 PM  Result Value Ref Range   Sodium 137 135 - 145 mmol/L   Potassium 3.5 3.5 - 5.1 mmol/L   Chloride 103 101 - 111 mmol/L   CO2 24 22 - 32 mmol/L   Glucose, Bld 115 (H) 65 - 99 mg/dL   BUN 13 6 - 20 mg/dL   Creatinine, Ser 1.00 0.44 - 1.00 mg/dL   Calcium 8.8 (L) 8.9 - 10.3 mg/dL   Total Protein 7.8 6.5 - 8.1 g/dL   Albumin 4.7 3.5 - 5.0 g/dL   AST 21 15 - 41 U/L   ALT 10 (L) 14 - 54 U/L   Alkaline Phosphatase 64 38 - 126 U/L   Total Bilirubin 0.7 0.3 - 1.2 mg/dL   GFR calc non Af Amer >60 >60 mL/min   GFR calc Af Amer >60 >60 mL/min    Comment: (NOTE) The eGFR has been calculated using  the CKD EPI equation. This calculation has not been validated in all clinical situations. eGFR's persistently <60 mL/min signify possible Chronic Kidney Disease.    Anion gap 10 5 - 15  Ethanol     Status: None   Collection Time: 06/13/16  9:56 PM  Result Value Ref Range   Alcohol, Ethyl (B) <5 <5 mg/dL    Comment:        LOWEST DETECTABLE LIMIT FOR SERUM ALCOHOL IS 5 mg/dL FOR MEDICAL PURPOSES ONLY   Salicylate level     Status: None   Collection Time: 06/13/16  9:56 PM  Result Value Ref Range   Salicylate Lvl <9.5 2.8 - 30.0 mg/dL  Acetaminophen level     Status: Abnormal   Collection Time: 06/13/16  9:56 PM  Result Value Ref Range   Acetaminophen (Tylenol), Serum <10 (L) 10 - 30 ug/mL    Comment:        THERAPEUTIC CONCENTRATIONS VARY SIGNIFICANTLY. A RANGE OF 10-30 ug/mL MAY BE AN EFFECTIVE CONCENTRATION FOR MANY PATIENTS. HOWEVER, SOME ARE BEST TREATED AT CONCENTRATIONS OUTSIDE THIS RANGE. ACETAMINOPHEN CONCENTRATIONS >150 ug/mL AT 4 HOURS AFTER INGESTION AND >50 ug/mL AT 12 HOURS AFTER INGESTION ARE OFTEN ASSOCIATED WITH TOXIC REACTIONS.   cbc     Status: None   Collection Time: 06/13/16  9:56 PM  Result Value Ref Range   WBC 9.7 3.6 - 11.0 K/uL   RBC 4.73 3.80 - 5.20 MIL/uL   Hemoglobin 13.9 12.0 - 16.0 g/dL   HCT 39.5 35.0 - 47.0 %   MCV 83.5 80.0 - 100.0 fL   MCH 29.3 26.0 - 34.0 pg   MCHC 35.1 32.0 - 36.0 g/dL   RDW 14.3 11.5 - 14.5 %   Platelets 204 150 - 440 K/uL  TSH     Status: Abnormal   Collection Time: 06/13/16  9:56 PM  Result Value Ref Range   TSH 60.474 (H) 0.350 - 4.500 uIU/mL  Urine Drug Screen, Qualitative     Status: Abnormal   Collection Time: 06/13/16 10:03 PM  Result Value Ref Range   Tricyclic, Ur Screen NONE DETECTED NONE DETECTED   Amphetamines, Ur Screen NONE DETECTED NONE DETECTED   MDMA (Ecstasy)Ur Screen NONE DETECTED NONE DETECTED   Cocaine Metabolite,Ur [place] POSITIVE (A) NONE DETECTED   Opiate, Ur Screen NONE DETECTED NONE  DETECTED   Phencyclidine (PCP) Ur S NONE DETECTED NONE DETECTED   Cannabinoid 50 Ng, Ur [place] NONE DETECTED NONE DETECTED  Barbiturates, Ur Screen NONE DETECTED NONE DETECTED   Benzodiazepine, Ur Scrn NONE DETECTED NONE DETECTED   Methadone Scn, Ur NONE DETECTED NONE DETECTED    Comment: (NOTE) 361  Tricyclics, urine               Cutoff 1000 ng/mL 200  Amphetamines, urine             Cutoff 1000 ng/mL 300  MDMA (Ecstasy), urine           Cutoff 500 ng/mL 400  Cocaine Metabolite, urine       Cutoff 300 ng/mL 500  Opiate, urine                   Cutoff 300 ng/mL 600  Phencyclidine (PCP), urine      Cutoff 25 ng/mL 700  Cannabinoid, urine              Cutoff 50 ng/mL 800  Barbiturates, urine             Cutoff 200 ng/mL 900  Benzodiazepine, urine           Cutoff 200 ng/mL 1000 Methadone, urine                Cutoff 300 ng/mL 1100 1200 The urine drug screen provides only a preliminary, unconfirmed 1300 analytical test result and should not be used for non-medical 1400 purposes. Clinical consideration and professional judgment should 1500 be applied to any positive drug screen result due to possible 1600 interfering substances. A more specific alternate chemical method 1700 must be used in order to obtain a confirmed analytical result.  1800 Gas chromato graphy / mass spectrometry (GC/MS) is the preferred 1900 confirmatory method.     Current Facility-Administered Medications  Medication Dose Route Frequency Provider Last Rate Last Dose  . acetaminophen (TYLENOL) tablet 650 mg  650 mg Oral Q6H PRN Harrie Foreman, MD       Or  . acetaminophen (TYLENOL) suppository 650 mg  650 mg Rectal Q6H PRN Harrie Foreman, MD      . cyclobenzaprine (FLEXERIL) tablet 10 mg  10 mg Oral TID PRN Harrie Foreman, MD      . docusate sodium (COLACE) capsule 100 mg  100 mg Oral BID Harrie Foreman, MD   100 mg at 06/14/16 0947  . enoxaparin (LOVENOX) injection 40 mg  40 mg Subcutaneous Q24H  Harrie Foreman, MD      . ergocalciferol (VITAMIN D2) capsule 50,000 Units  50,000 Units Oral Weekly Harrie Foreman, MD   50,000 Units at 06/14/16 [phone]  . gabapentin (NEURONTIN) tablet 600 mg  600 mg Oral TID Harrie Foreman, MD   600 mg at 06/14/16 0947  . HYDROcodone-acetaminophen (NORCO) 10-325 MG per tablet 1 tablet  1 tablet Oral QID PRN Harrie Foreman, MD   1 tablet at 06/14/16 1206  . levothyroxine (SYNTHROID, LEVOTHROID) tablet 150 mcg  150 mcg Oral Q0600 Lenis Noon, RPH   150 mcg at 06/14/16 0539  . morphine 2 MG/ML injection 2 mg  2 mg Intravenous Q4H PRN Harrie Foreman, MD      . multivitamin with minerals tablet 1 tablet  1 tablet Oral Daily Harrie Foreman, MD   1 tablet at 06/14/16 [phone]  . ondansetron (ZOFRAN) tablet 4 mg  4 mg Oral Q6H PRN Harrie Foreman, MD       Or  . ondansetron [facility]) injection 4 mg  4  mg Intravenous Q6H PRN Harrie Foreman, MD        Musculoskeletal: Strength & Muscle Tone: within normal limits Gait & Station: normal Patient leans: N/A  Psychiatric Specialty Exam: I reviewed physical exam performed by the medicine attending and agree with her findings. Physical Exam  Nursing note and vitals reviewed.   Review of Systems  Psychiatric/Behavioral: Positive for depression, suicidal ideas, hallucinations and substance abuse. The patient is nervous/anxious and has insomnia.   All other systems reviewed and are negative.   Blood pressure 128/82, pulse 64, temperature 98.9 F (37.2 C), temperature source Oral, resp. rate 18, height 5' 7"  (1.702 m), weight 93.214 kg (205 lb 8 oz), SpO2 98 %.Body mass index is 32.18 kg/(m^2).  General Appearance: Casual  Eye Contact:  Good  Speech:  Clear and Coherent  Volume:  Normal  Mood:  Anxious  Affect:  Congruent  Thought Process:  Goal Directed  Orientation:  Full (Time, Place, and Person)  Thought Content:  Hallucinations: Auditory Command:  Telling her to hurt herself.  Suicidal  Thoughts:  Yes.  with intent/plan  Homicidal Thoughts:  No  Memory:  Immediate;   Fair Recent;   Fair Remote;   Fair  Judgement:  Impaired  Insight:  Shallow  Psychomotor Activity:  Normal  Concentration:  Concentration: Fair and Attention Span: Fair  Recall:  [employer] of Knowledge:  Fair  Language:  Fair  Akathisia:  No  Handed:  Right  AIMS (if indicated):     Assets:  Communication Skills Desire for Improvement Financial Resources/Insurance Housing Resilience Social Support  ADL's:  Intact  Cognition:  WNL  Sleep:        Treatment Plan Summary: Plan The patient meets criteria for psychiatric admission. Will transfer to psychiatry.  Disposition: Recommend psychiatric Inpatient admission when medically cleared. Supportive therapy provided about ongoing stressors. Discussed crisis plan, support from social network, calling 911, coming to the Emergency Department, and calling Suicide Hotline.  Orson Slick, MD 06/14/2016 1:14 PM

## 2016-06-14 NOTE — ED Notes (Signed)
[facility]. Computer on and pt awaiting MD on screen.

## 2016-06-14 NOTE — Discharge Summary (Signed)
Carolyn [name], is a 30 y.o. female  DOB 08/23/1986  MRN [id number].  Admission date:  06/13/2016  Admitting Physician  Arnaldo NatalMichael S Diamond, MD  Discharge Date:  06/14/2016   Primary MD  Alleen BorneLAGGETT,ELIN, PA-C  Recommendations for primary care physician for things to follow:   Is being transferred to psychiatric floor.   Admission Diagnosis  BH   Discharge Diagnosis  BH    Active Problems:   Hypothyroidism      Past Medical History  Diagnosis Date  . Thyroid disease   . Grave's disease   . Ovarian cyst   . Acute kidney injury (H[facility])   . Migraines   . Depression   . Anxiety   . Bipolar 1 disorder [facility](HCC)     Past Surgical History  Procedure Laterality Date  . Thyroid surgery    . Fracture surgery    . Dilation and curettage of uterus    . Dilation and curettage of uterus  05/10/2012    Procedure: DILATATION AND CURETTAGE;  Surgeon: Tilda BurrowJohn V Ferguson, MD;  Location: [facility];  Service: Gynecology;  Laterality: N/A;  suction  . Iud removal  05/10/2012    Procedure: INTRAUTERINE DEVICE (IUD) REMOVAL;  Surgeon: Tilda BurrowJohn V Ferguson, MD;  Location: [facility];  Service: Gynecology;;  . Ankle surgery Left     pin put in  . Dilation and curettage of uterus N/A 06/19/2013    Procedure: DILATATION AND SUCTION CURETTAGE;  Surgeon: Tilda BurrowJohn V Ferguson, MD;  Location: [facility];  Service: Gynecology;  Laterality: N/A;       History of present illness and  Hospital Course:     Kindly see H&P for history of present illness and admission details, please review complete Labs, Consult reports and Test reports for all details in brief  HPI  from the history and physical done on the day of admission[age] year old female patient with history of depression, hypothyroidism came in with suicidal thoughts. Found to have a hypothyroidism with TSH of 60 ,so she is  admitted to medical service.    Hospital Course  . Hypothyroidism: Patient has chronic hypothyroidism and also has issues with TSH levels. Recently her dose has been increased to 125 g of  Levothyroxine. Because of elevated TSH here  she started on 150 g daily. She can continue that. Patient has no other medical needs that she needed to be staying in the medical unit. [place] and she can follow up with her as an outpatient. #2 depression and history of bipolar disorder patient says that she is feeling more depressed and also sometimes in any acute concerning this patient has seen by psychiatry, Dr. Fredrik CovePuchilowska,recommended inpatient admission to behavioral unit. Patient medically stable and she can go down there today.spoke with Dr.Puchilowska,   Discharge Condition: stable  Follow UP      Discharge Instructions  and  Discharge Medications     Medication List    ASK your doctor about these medications        cyclobenzaprine 10 MG tablet  Commonly known as:  FLEXERIL  Take 10 mg by mouth 3 (three) times daily as needed for muscle spasms.     DRISDOL [id number] units capsule  Generic drug:  ergocalciferol  Take 1 capsule by mouth once a week. Patient takes on Monday     gabapentin 600 MG tablet  Commonly known as:  NEURONTIN  Take 600 mg by mouth 3 (three) times daily.  HYDROcodone-acetaminophen 10-325 MG tablet  Commonly known as:  NORCO  Take 1 tablet by mouth 4 (four) times daily as needed for moderate pain.     Levothyroxine Sodium 150 MCG Caps  Commonly known as:  TIROSINT  Take 1 capsule (150 mcg total) by mouth daily before breakfast.     multivitamin with minerals tablet  Take 1 tablet by mouth daily.          Diet and Activity recommendation: See Discharge Instructions above   Consults obtained -psych   Major procedures and Radiology Reports - PLEASE review detailed and final reports for all details, in brief -       No results found.  Micro Results    No results found for this or any previous visit (from the past 240 hour(s)).     Today   Subjective:   [facility] today ,stable for transfer to psych unit.  Objective:   Blood pressure 128/82, pulse 64, temperature 98.9 F (37.2 C), temperature source Oral, resp. rate 18, height 5\' 7"  (1.702 m), weight 93.214 kg (205 lb 8 oz), SpO2 98 %.   Intake/Output Summary (Last 24 hours) at 06/14/16 1302 Last data filed at 06/14/16 0900  Gross per 24 hour  Intake    240 ml  Output      0 ml  Net    240 ml    Exam Awake Alert, Oriented x 3, No new F.N deficits, Normal affect [place].AT,PERRAL Supple Neck,No JVD, No cervical lymphadenopathy appriciated.  Symmetrical Chest wall movement, Good air movement bilaterally, CTAB RRR,No Gallops,Rubs or new Murmurs, No Parasternal Heave +ve B.Sounds, Abd Soft, Non tender, No organomegaly appriciated, No rebound -guarding or rigidity. No Cyanosis, Clubbing or edema, No new Rash or bruise  Data Review   CBC w Diff: Lab Results  Component Value Date   WBC 9.7 06/13/2016   WBC 9.8 04/18/2014   HGB 13.9 06/13/2016   HGB 14.0 04/18/2014   HCT 39.5 06/13/2016   HCT 40.7 04/18/2014   PLT 204 06/13/2016   PLT 180 04/18/2014   LYMPHOPCT 31 01/14/2016   LYMPHOPCT 38.5 01/12/2012   MONOPCT 4 01/14/2016   MONOPCT 5.4 01/12/2012   EOSPCT 3 01/14/2016   EOSPCT 3.2 01/12/2012   BASOPCT 1 01/14/2016   BASOPCT 1.1 01/12/2012    CMP: Lab Results  Component Value Date   NA 137 06/13/2016   NA 138 04/18/2014   K 3.5 06/13/2016   K 4.2 04/18/2014   CL 103 06/13/2016   CL 107 04/18/2014   CO2 24 06/13/2016   CO2 26 04/18/2014   BUN 13 06/13/2016   BUN 11 04/18/2014   CREATININE 1.00 06/13/2016   CREATININE 0.97 04/18/2014   PROT 7.8 06/13/2016   PROT 8.5* 04/18/2014   ALBUMIN 4.7 06/13/2016   ALBUMIN 4.6 04/18/2014   BILITOT 0.7 06/13/2016   BILITOT 0.7 04/18/2014   ALKPHOS 64 06/13/2016    ALKPHOS 66 04/18/2014   AST 21 06/13/2016   AST 24 04/18/2014   ALT 10* 06/13/2016   ALT 18 04/18/2014  .   Total Time in preparing paper work, data evaluation and todays exam - 35 minutes  Sharda Keddy M.D on 06/14/2016 at 1:02 PM    Note: This dictation was prepared with Dragon dictation along with smaller phrase technology. Any transcriptional errors that result from this process are unintentional.

## 2016-06-14 NOTE — ED Notes (Signed)
SOC consult completed  
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
## 2016-06-14 NOTE — Care Management Note (Signed)
Case Management Note  Patient Details  Name: Charlott RakesLeah R [place]land MRN: [id number] Date of Birth: 08/15/1986  Subjective/Objective:     Spoke with patiet who answers questions appropriately. She is from home and independent with support.  [facility] and [facility]. Denies diffiuclty filling meds has Medicaid. NO DME. No CM needs identified.               Action/Plan: Anticipate discharge Home with Self care.   Expected Discharge Date:                  Expected Discharge Plan:  Home/Self Care  In-House Referral:     Discharge planning Services  CM Consult  Post Acute Care Choice:    Choice offered to:     DME Arranged:    DME Agency:     HH Arranged:    HH Agency:     Status of Service:  Completed, signed off  If discussed at [employer] Length of Stay Meetings, dates discussed:    Additional Comments:  Adonis HugueninBerkhead, Nachmen Mansel L, RN 06/14/2016, 1:20 PM

## 2016-06-14 NOTE — ED Provider Notes (Signed)
-----------------------------------------   3:41 AM on 06/14/2016 -----------------------------------------   Blood pressure 125/83, pulse 62, temperature 98.2 F (36.8 C), temperature source Oral, resp. rate 18, height 5\' 7"  (1.702 m), weight 198 lb (89.812 kg), SpO2 96 %.  The patient had no acute events since last update.  Calm and cooperative at this time.  Disposition is pending per Psychiatry/Behavioral Medicine team recommendations.     Arnaldo NatalPaul F Kaelea Gathright, MD 06/14/16 [phone]

## 2016-06-14 NOTE — ED Notes (Signed)
Dr Sheryle Hailiamond at bedside to see pt for admission.

## 2016-06-14 NOTE — Progress Notes (Signed)
Patient transferred to Behavioral Med and report called to Behavioral Med spoke to [place]. Patient is alert and oriented. No acute distress noted.

## 2016-06-15 DIAGNOSIS — F333 Major depressive disorder, recurrent, severe with psychotic symptoms: Principal | ICD-10-CM

## 2016-06-15 DIAGNOSIS — R45851 Suicidal ideations: Secondary | ICD-10-CM

## 2016-06-15 LAB — LIPID PANEL
CHOLESTEROL: 161 mg/dL (ref 0–200)
HDL: 40 mg/dL — ABNORMAL LOW (ref >40)
LDL CALC: 100 mg/dL — AB (ref 0–99)
TRIGLYCERIDES: 103 mg/dL (ref <150)
Total CHOL/HDL Ratio: 4 RATIO
VLDL: 21 mg/dL (ref 0–40)

## 2016-06-15 LAB — T4: T4 TOTAL: 6.1 ug/dL (ref 4.5–12.0)

## 2016-06-15 LAB — HEMOGLOBIN A1C: Hgb A1c MFr Bld: 5.4 % (ref 4.0–6.0)

## 2016-06-15 MED ORDER — CHLORPROMAZINE HCL 50 MG PO TABS
50.0000 mg | ORAL_TABLET | Freq: Three times a day (TID) | ORAL | Status: DC | PRN
  Administered 2016-06-16: 50 mg via ORAL
  Filled 2016-06-15: qty 1

## 2016-06-15 NOTE — [facility] Suicide Risk Assessment (Signed)
[facility] INPATIENT:  Family/Significant Other Suicide Prevention Education  Suicide Prevention Education:  Education Completed; boyfriend, Carolyn [name] ph #: [phone] has been identified by the patient as the family member/significant other with whom the patient will be residing, and identified as the person(s) who will aid the patient in the event of a mental health crisis (suicidal ideations/suicide attempt).  With written consent from the patient, the family member/significant other has been provided the following suicide prevention education, prior to the and/or following the discharge of the patient.  The suicide prevention education provided includes the following:  Suicide risk factors  Suicide prevention and interventions  National Suicide Hotline telephone number  [facility] assessment telephone number  [facility] Assistance 911  [facility] and/or [facility]idential Mobile Crisis [facility] telephone number  Request made of family/significant other to:  Remove weapons (e.g., guns, rifles, knives), all items previously/currently identified as safety concern.    Remove drugs/medications (over-the-counter, prescriptions, illicit drugs), all items previously/currently identified as a safety concern.  The family member/significant other verbalizes understanding of the suicide prevention education information provided.  The family member/significant other agrees to remove the items of safety concern listed above.  Lynden OxfordKadijah R Demaya Hardge, LCSW-A 06/15/2016, 2:43 PM

## 2016-06-15 NOTE — Progress Notes (Signed)
Recreation Therapy Notes  INPATIENT RECREATION THERAPY ASSESSMENT  Patient Details Name: Carolyn [name] MRN: [id number] DOB: 01/31/1986 Today's Date: 06/15/2016  Patient Stressors: Death, Other (Comment) (Step son died in August and his birthday was in June; Finances)  Coping Skills:   Isolate, Substance Abuse, Avoidance, Art/Dance, Music, Sports  Personal Challenges: Anger, Communication, Concentration, Decision-Making, Expressing Yourself, Problem-Solving, Self-Esteem/Confidence, Social Interaction, Stress Management, Trusting Others  Leisure Interests (2+):  Individual - Other (Comment), Art - Coloring (Cook/bake)  Awareness of Community Resources:  No  Community Resources:     Current Use:    If no, Barriers?:    Patient Strengths:  Help others  Patient Identified Areas of Improvement:  Communication, being able to control her "spells" without flipping out  Current Recreation Participation:  Play with kids outside, fishing, swimming  Patient Goal for Hospitalization:  To get back to the person she used to be  [place]  [place]   Current SI (including self-harm):  No  Current HI:  No  Consent to Intern Participation: N/A   Carolyn [name],Carolyn [name], LRT/CTRS 06/15/2016, 3:43 PM

## 2016-06-15 NOTE — Progress Notes (Signed)
D: Pt denies SI/HI/AVH. Pt is pleasant and cooperative, patent was medicated per MD's order for anxiety and she appears less anxious. Patient stayed in her  room throughout the shift, minimal interaction with interaction staff.  A: Pt was offered support and encouragement. Pt was given scheduled medications. Pt was encouraged to attend groups. Q 15 minute checks were done for safety.  R:Pt attends groups and interacts well with peers and staff. Pt is taking medication. Pt has no complaints.Pt receptive to treatment and safety maintained on unit.

## 2016-06-15 NOTE — [facility] Suicide Risk Assessment (Signed)
[facility]   Nursing information obtained from:  Patient Demographic factors:  Caucasian, Adolescent or young adult, Unemployed Current Mental Status:  Self-harm thoughts Loss Factors:  Loss of significant relationship (Stepson killed in car crash 3 yrs ago) Historical Factors:  NA Risk Reduction Factors:  Responsible for children under 29 years of age, Sense of responsibility to family, Religious beliefs about death, Positive social support, Positive therapeutic relationship, Positive coping skills or problem solving skills  Total Time spent with patient: 1 hour Principal Problem: Major depressive disorder, recurrent, severe with psychotic features (HCC) Diagnosis:   Patient Active Problem List   Diagnosis Date Noted  . Suicidal ideation [R45.851] 06/15/2016  . Hypothyroidism [E03.9] 06/14/2016  . Major depressive disorder, recurrent, severe with psychotic features (HCC) [F33.3] 06/14/2016  . PTSD (post-traumatic stress disorder) [F43.10] 06/14/2016  . OCD (obsessive compulsive disorder) [F42.9] 06/14/2016  . 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 Cocaine use disorder, moderate, dependence (HCC) [F14.20] 06/14/2016   Subjective Data: Depression, anxiety, suicidal ideation, auditory hallucinations, substance abuse.  Continued Clinical Symptoms:  Alcohol Use Disorder Identification Test Final Score (AUDIT): 1 The "Alcohol Use Disorders Identification Test", Guidelines for Use in Primary Care, Second Edition.  World Science writerHealth Organization [facility](WHO). Score between 0-7:  no or low risk or alcohol related problems. Score between 8-15:  moderate risk of alcohol related problems. Score between 16-19:  high risk of alcohol related problems. Score 20 or above:  warrants further diagnostic evaluation for alcohol dependence and treatment.   CLINICAL FACTORS:   Severe Anxiety and/or Agitation Panic Attacks Depression:   Comorbid  alcohol abuse/dependence Impulsivity Alcohol/Substance Abuse/Dependencies   Musculoskeletal: Strength & Muscle Tone: within normal limits Gait & Station: normal Patient leans: N/A  Psychiatric Specialty Exam: Physical Exam  Nursing note and vitals reviewed.   Review of Systems  Psychiatric/Behavioral: Positive for suicidal ideas, hallucinations and substance abuse. The patient is nervous/anxious.   All other systems reviewed and are negative.   Blood pressure 110/77, pulse 105, temperature 98.9 F (37.2 C), temperature source Oral, resp. rate 18, height 5\' 7"  (1.702 m), weight 91.853 kg (202 lb 8 oz), SpO2 100 %.Body mass index is 31.71 kg/(m^2).  General Appearance: Casual  Eye Contact:  Good  Speech:  Clear and Coherent  Volume:  Normal  Mood:  Depressed  Affect:  Appropriate  Thought Process:  Goal Directed  Orientation:  Full (Time, Place, and Person)  Thought Content:  Hallucinations: Auditory  Suicidal Thoughts:  Yes.  with intent/plan  Homicidal Thoughts:  No  Memory:  Immediate;   Fair Recent;   Fair Remote;   Fair  Judgement:  Impaired  Insight:  Shallow  Psychomotor Activity:  Normal  Concentration:  Concentration: Fair and Attention Span: Fair  Recall:  [employer]  Fund of Knowledge:  Fair  Language:  Fair  Akathisia:  No  Handed:  Right  AIMS (if indicated):     Assets:  Communication Skills Desire for Improvement Financial Resources/Insurance Housing Intimacy Physical Health Resilience Social Support  ADL's:  Intact  Cognition:  WNL  Sleep:  Number of Hours: 7.5      COGNITIVE FEATURES THAT CONTRIBUTE TO RISK:  None    SUICIDE RISK:   Moderate:  Frequent suicidal ideation with limited intensity, and duration, some specificity in terms of plans, no associated intent, good self-control, limited dysphoria/symptomatology, some risk factors present, and identifiable protective factors, including available and accessible social support.  PLAN OF  CARE:  Hospital admission, medication management, substance abuse counseling, discharge planning.  [name] is a 30 year old female with a history of depression, anxiety, psychosis, mood instability and substance use admitted for suicidal ideation and auditory command hallucinations in the context of treatment noncompliance and social stressors.  1. Suicidal ideation. The patient is able to contract for safety in the hospital.  2. Mood. She was started on Risperdal for psychosis and Effexor for depression and anxiety.  3. Hypothyroidism. We will continue Synthroid.  4. Insomnia. She is on trazodone.   5. Vitamin D deficiency. We'll continue supplementation.  6. Back pain. She is on Neurontin. No controlled substances will be prescribed.  7. Metabolic syndromes screening. Lipid panel, hemoglobin A1c and prolactin are pending.  8. Substance abuse. He is positive for cocaine but denies substance use. He is not interested in substance abuse treatment.  9. Disposition. She will be discharged to home with her boyfriend. Follow up with [facility].  I certify that inpatient services furnished can reasonably be expected to improve the patient's condition.   Kristine LineaJolanta Yakelin Grenier, MD 06/15/2016, 9:30 AM

## 2016-06-15 NOTE — H&P (Signed)
Psychiatric Admission Assessment Adult  Patient Identification: Carolyn [name] MRN:  [id number] Date of Evaluation:  06/15/2016 Chief Complaint:  Major Depression Disorder Principal Diagnosis: Major depressive disorder, recurrent, severe with psychotic features (HCC) Diagnosis:   Patient Active Problem List   Diagnosis Date Noted  . Suicidal ideation [R45.851] 06/15/2016  . Hypothyroidism [E03.9] 06/14/2016  . Major depressive disorder, recurrent, severe with psychotic features (HCC) [F33.3] 06/14/2016  . PTSD (post-traumatic stress disorder) [F43.10] 06/14/2016  . OCD (obsessive compulsive disorder) [F42.9] 06/14/2016  . 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 Cocaine use disorder, moderate, dependence (HCC) [F14.20] 06/14/2016   History of Present Illness:   Identifying data. [name] is a 30 y.o. female with a history of depression, anxiety, psychosis, mood instability and substance use.  Chief complaint. "I think I might give in to my hallucinations."  History of present illness. Information was obtained from the patient and the chart. The patient has a long history of depression and mood instability with several prior hospitalizations. She came to the hospital complaining of mood instability, auditory command hallucination and suicidal ideation. She was briefly hospitalized on medical floor for severe hypothyroidism. She has been off her psychiatric medications for several years now. In the past couple of months became increasingly depressed. She believes that this is in association with the anniversary of losing her stepson died in an accident. It also coincided with her son's birthday. Upcoming for life quality made it even worse. The patient reports many symptoms of depression with poor sleep, decreased appetite, poor energy and concentration, social isolation, crying spells. She also developed hallucinations commanding her to hurt herself. She  has no intention to attempt suicide but is tired of the voices and worried that she may give in. She reports profound mood swings with periods of depressed mood extremely low energy and high anxiety. They are interspace with brief periods of hyperactivity, insomnia, irritability. She reports many symptoms of depression with frequent panic attacks, social anxiety, PTSD symptoms stemming from losing her stepson with nightmares and flashbacks and OCD symptoms. She denies alcohol or illicit substance use but is positive for cocaine.  Past psychiatric history. She had 2 prior psychiatric hospitalizations. There were no suicide attempts. She has not been under psychiatric care for several years.  Family psychiatric history. Her father has severe anxiety and schizoaffective disorder. He is now on medications during better.  Social history. She lives with her boyfriend and 3 children ages 91, 2 and 25. 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 She has a history of severe hypothyroidism that is difficult to control. I would not rule out noncompliance.  Total Time spent with patient: 1 hour  Is the patient at risk to self? Yes.    Has the patient been a risk to self in the past 6 months? Yes.    Has the patient been a risk to self within the distant past? No.  Is the patient a risk to others? No.  Has the patient been a risk to others in the past 6 months? No.  Has the patient been a risk to others within the distant past? No.   Prior Inpatient Therapy:   Prior Outpatient Therapy:    Alcohol Screening: 1. How often do you have a drink containing alcohol?: Monthly or less 2. How many drinks containing alcohol do you have on a typical day when you are drinking?: 1 or 2 3. How often do you have six or more  drinks on one occasion?: Never Preliminary Score: 0 9. Have you or someone else been injured as a result of your drinking?: No 10. Has a relative or friend or a doctor or another health worker been concerned about your  drinking or suggested you cut down?: No Alcohol Use Disorder Identification Test Final Score (AUDIT): 1 Brief Intervention: AUDIT score less than 7 or less-screening does not suggest unhealthy drinking-brief intervention not indicated Substance Abuse History in the last 12 months:  Yes.   Consequences of Substance Abuse: Negative Previous Psychotropic Medications: Yes  Psychological Evaluations: No  Past Medical History:  Past Medical History  Diagnosis Date  . Thyroid disease   . Grave's disease   . Ovarian cyst   . Acute kidney injury (HCC)   . Migraines   . Depression   . Anxiety   . Bipolar 1 disorder [facility])     Past Surgical History  Procedure Laterality Date  . Thyroid surgery    . Fracture surgery    . Dilation and curettage of uterus    . Dilation and curettage of uterus  05/10/2012    Procedure: DILATATION AND CURETTAGE;  Surgeon: Tilda Burrow, MD;  Location: [facility];  Service: Gynecology;  Laterality: N/A;  suction  . Iud removal  05/10/2012    Procedure: INTRAUTERINE DEVICE (IUD) REMOVAL;  Surgeon: Tilda Burrow, MD;  Location: [facility];  Service: Gynecology;;  . Ankle surgery Left     pin put in  . Dilation and curettage of uterus N/A 06/19/2013    Procedure: DILATATION AND SUCTION CURETTAGE;  Surgeon: Tilda Burrow, MD;  Location: [facility];  Service: Gynecology;  Laterality: N/A;   Family History:  Family History  Problem Relation Age of Onset  . Anesthesia problems Neg Hx   . Hypotension Neg Hx   . Malignant hyperthermia Neg Hx   . Pseudochol deficiency Neg Hx   . Diabetes Mother   . Hypertension Mother   . Hyperlipidemia Mother   . Cancer Mother   . Hypertension Father   . Hyperlipidemia Father   . Cancer Maternal Grandmother     uterine   . Heart disease Maternal Grandfather    Tobacco Screening: @FLOW ([phone])::1)@ Social History:  History  Alcohol Use  . Yes    Comment: social     History  Drug Use  . Yes    Comment: used crack this past  thursday    Additional Social History:                           Allergies:   Allergies  Allergen Reactions  . Aleve [Naproxen Sodium] Nausea And Vomiting    'makes headaches worse"  . Iodine Other (See Comments)    Causes blackouts  . Tylenol [Acetaminophen] Nausea And Vomiting   Lab Results:  Results for orders placed or performed during the hospital encounter of 06/14/16 (from the past 48 hour(s))  Lipid panel, fasting     Status: Abnormal   Collection Time: 06/15/16  6:54 AM  Result Value Ref Range   Cholesterol 161 0 - 200 mg/dL   Triglycerides 914 <782 mg/dL   HDL 40 (L) >95 mg/dL   Total CHOL/HDL Ratio 4.0 RATIO   VLDL 21 0 - 40 mg/dL   LDL Cholesterol 621 (H) 0 - 99 mg/dL    Comment:        Total Cholesterol/HDL:CHD Risk Coronary Heart Disease Risk Table  Men   Women  1/2 Average Risk   3.4   3.3  Average Risk       5.0   4.4  2 X Average Risk   9.6   7.1  3 X Average Risk  23.4   11.0        Use the calculated Patient Ratio above and the CHD Risk Table to determine the patient's CHD Risk.        ATP III CLASSIFICATION (LDL):  <100     mg/dL   Optimal  161-096100-129  mg/dL   Near or Above                    Optimal  130-159  mg/dL   Borderline  045-409160-189  mg/dL   High  >811>190     mg/dL   Very High     Blood Alcohol level:  Lab Results  Component Value Date   ETH <5 06/13/2016    Metabolic Disorder Labs:  Lab Results  Component Value Date   HGBA1C 5.4 06/13/2016   No results found for: PROLACTIN Lab Results  Component Value Date   CHOL 161 06/15/2016   TRIG 103 06/15/2016   HDL 40* 06/15/2016   CHOLHDL 4.0 06/15/2016   VLDL 21 06/15/2016   LDLCALC 100* 06/15/2016    Current Medications: Current Facility-Administered Medications  Medication Dose Route Frequency Provider Last Rate Last Dose  . acetaminophen (TYLENOL) tablet 650 mg  650 mg Oral Q6H PRN Jachob Mcclean B Gracin Soohoo, MD      . alum & mag hydroxide-simeth  (MAALOX/MYLANTA) 200-200-20 MG/5ML suspension 30 mL  30 mL Oral Q4H PRN Brayen Bunn B Kashira Behunin, MD      . docusate sodium (COLACE) capsule 100 mg  100 mg Oral BID Shari ProwsJolanta B Kikue Gerhart, MD   100 mg at 06/15/16 0904  . gabapentin (NEURONTIN) tablet 600 mg  600 mg Oral TID Shari ProwsJolanta B Aundra Espin, MD   600 mg at 06/15/16 0904  . levothyroxine (SYNTHROID, LEVOTHROID) tablet 150 mcg  150 mcg Oral QAC breakfast Shari ProwsJolanta B Zayvon Alicea, MD   150 mcg at 06/15/16 0655  . magnesium hydroxide (MILK OF MAGNESIA) suspension 30 mL  30 mL Oral Daily PRN Seraj Dunnam B Kennette Cuthrell, MD      . multivitamin with minerals tablet 1 tablet  1 tablet Oral Daily Shari ProwsJolanta B Tallis Soledad, MD   1 tablet at 06/15/16 0904  . risperiDONE (RISPERDAL) tablet 2 mg  2 mg Oral BID Shari ProwsJolanta B Maleia Weems, MD   2 mg at 06/15/16 0904  . traZODone (DESYREL) tablet 100 mg  100 mg Oral QHS PRN Shari ProwsJolanta B Ramone Gander, MD   100 mg at 06/14/16 2134  . venlafaxine XR (EFFEXOR-XR) 24 hr capsule 150 mg  150 mg Oral Q breakfast Kurk Corniel B Cleotilde Spadaccini, MD   150 mg at 06/15/16 0904  . [START ON 06/21/2016] Vitamin D (Ergocalciferol) (DRISDOL) capsule 50,000 Units  50,000 Units Oral Weekly Jadelin Eng B Asees Manfredi, MD       PTA Medications: Prescriptions prior to admission  Medication Sig Dispense Refill Last Dose  . ergocalciferol (DRISDOL) 50000 units capsule Take 1 capsule by mouth once a week. Patient takes on Monday   Past Week at Unknown time  . gabapentin (NEURONTIN) 600 MG tablet Take 600 mg by mouth 3 (three) times daily.  0 06/13/2016 at Unknown time  . Levothyroxine Sodium (TIROSINT) 150 MCG CAPS Take 1 capsule (150 mcg total) by mouth daily before breakfast. 30 capsule 6 06/14/2016 at Unknown  time  . Multiple Vitamins-Minerals (MULTIVITAMIN WITH MINERALS) tablet Take 1 tablet by mouth daily.   06/13/2016 at Unknown time    Musculoskeletal: Strength & Muscle Tone: within normal limits Gait & Station: normal Patient leans: N/A  Psychiatric Specialty Exam: I  reviewed physical examination performed on the medical floor and agree with the findings. Physical Exam  Nursing note and vitals reviewed.   Review of Systems  Psychiatric/Behavioral: Positive for depression, suicidal ideas, hallucinations and substance abuse. The patient is nervous/anxious.   All other systems reviewed and are negative.   Blood pressure 110/77, pulse 105, temperature 98.9 F (37.2 C), temperature source Oral, resp. rate 18, height 5\' 7"  (1.702 m), weight 91.853 kg (202 lb 8 oz), SpO2 100 %.Body mass index is 31.71 kg/(m^2).  See SRA.                                                  Sleep:  Number of Hours: 7.5       Treatment Plan Summary: Daily contact with patient to assess and evaluate symptoms and progress in treatment and Medication management   Carolyn [name] is a 30 year old female with a history of depression, anxiety, psychosis, mood instability and substance use admitted for suicidal ideation and auditory command hallucinations in the context of treatment noncompliance and social stressors.  1. Suicidal ideation. The patient is able to contract for safety in the hospital.  2. Mood. She was started on Risperdal for psychosis and Effexor for depression and anxiety.  3. Hypothyroidism. We will continue Synthroid.  4. Insomnia. She is on trazodone.   5. Vitamin D deficiency. We'll continue supplementation.  6. Back pain. She is on Neurontin. No controlled substances will be prescribed.  7. Metabolic syndromes screening. Lipid panel, hemoglobin A1c and prolactin are pending.  8. Substance abuse. He is positive for cocaine but denies substance use. He is not interested in substance abuse treatment.  9. Agitation. The patient was agitated last night. Will give Thorazine as needed.   10. Disposition. She will be discharged to home with her boyfriend. Follow up with [facility].   Observation Level/Precautions:  15 minute checks  Laboratory:   CBC Chemistry Profile UDS UA  Psychotherapy:    Medications:    Consultations:    Discharge Concerns:    Estimated LOS:  Other:     I certify that inpatient services furnished can reasonably be expected to improve the patient's condition.    Kristine LineaJolanta Pearla Mckinny, MD 7/4/20179:35 AM

## 2016-06-15 NOTE — [facility] Counselor (Deleted)
[facility] LCSW Group Therapy   06/15/2016 9:30 am  Type of Therapy: Group Therapy   Participation Level: Invited but did not attend.  Participation Quality: Invited but did not attend.    [place] Jellison R. Brigid Vandekamp, LCSWA   
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
## 2016-06-15 NOTE — Progress Notes (Signed)
Isolated to room most of shift sleeping. Called several times for meals and medications, takes lots of encouragement for pt to reply. Denies SI, HI, AVH.  Encouragement and support offered, pt receptive and remains safe on unit with q 15 min checks.

## 2016-06-15 NOTE — [facility] Group Notes (Signed)
[facility] LCSW Group Therapy   06/15/2016 9:30 am  Type of Therapy: Group Therapy   Participation Level: Invited but did not attend.  Participation Quality: Invited but did not attend.    Carolyn [name] R. Annella Prowell, LCSWA   

## 2016-06-15 NOTE — Plan of Care (Signed)
Problem: BHH Concurrent Medical Problem Goal: STG-Compliance with medication and/or treatment as ordered (STG-Compliance with medication and/or treatment as ordered by MD)  Outcome: Progressing Med compliant

## 2016-06-15 NOTE — Tx Team (Signed)
Interdisciplinary Treatment Plan Update (Adult)  Date:  06/15/2016 Time Reviewed:  4:05 PM  Progress in Treatment: Attending groups: Yes. Participating in groups:  Yes. Taking medication as prescribed:  Yes. Tolerating medication:  Yes. Family/Significant othe contact made:  Yes, individual(s) contacted:  boyfriend Patient understands diagnosis:  Yes. Discussing patient identified problems/goals with staff:  Yes. Medical problems stabilized or resolved:  Yes. Denies suicidal/homicidal ideation: Yes.contracts for safety in hospital Issues/concerns per patient self-inventory:  No. Other:  New problem(s) identified: No, Describe:     Discharge Plan or [place] home and follow up with outpatient in [facility]  Reason for Continuation of Hospitalization: Depression Hallucinations Medication stabilization  Comments:The patient has a long history of depression and mood instability with several prior hospitalizations. She came to the hospital complaining of mood instability, auditory command hallucination and suicidal ideation. She was briefly hospitalized on medical floor for severe hypothyroidism. She has been off her psychiatric medications for several years now. In the past couple of months became increasingly depressed. She believes that this is in association with the anniversary of losing her stepson died in an accident. It also coincided with her son's birthday. Upcoming for life quality made it even worse. The patient reports many symptoms of depression with poor sleep, decreased appetite, poor energy and concentration, social isolation, crying spells. She also developed hallucinations commanding her to hurt herself. She has no intention to attempt suicide but is tired of the voices and worried that she may give in. She reports profound mood swings with periods of depressed mood extremely low energy and high anxiety. They are interspace with brief periods of hyperactivity,  insomnia, irritability. She reports many symptoms of depression with frequent panic attacks, social anxiety, PTSD symptoms stemming from losing her stepson with nightmares and flashbacks and OCD symptoms. She denies alcohol or illicit substance use but is positive for cocaine.  Estimated length of stay:2-3 days  New goal(s):  Review of initial/current patient goals per problem list:   1.  Goal(s): Patient will participate in aftercare plan * Met: NO * Target date: at discharge * As evidenced by: Patient will participate within aftercare plan AEB aftercare provider and housing plan at discharge being identified.   2.  Goal (s): Patient will exhibit decreased depressive symptoms and suicidal ideations. * Met:NO  *  Target date: at discharge * As evidenced by: Patient will utilize self-rating of depression at 3 or below and demonstrate decreased signs of depression or be deemed stable for discharge by MD.   3.  Goal(s): Patient will demonstrate decreased signs and symptoms of anxiety. * Met: NO * Target date: at discharge * As evidenced by: Patient will utilize self-rating of anxiety at 3 or below and demonstrated decreased signs of anxiety, or be deemed stable for discharge by MD   4.  Goal (s): Patient will demonstrate decreased symptoms of psychosis. * Met: NO *  Target date: at discharge As evidenced by: Patient will not endorse signs of psychosis or be deemed stable for discharge by MD.  Attendees: Patient:  Lamont Snowball 7/4/20174:05 PM  Family:   7/4/20174:05 PM  Physician:  Orson Slick 7/4/20174:05 PM  Nursing:   Floyde Parkins, RN 7/4/20174:05 PM  Case Manager:   7/4/20174:05 PM  Counselor:  Dossie Arbour, LCSW 7/4/20174:05 PM  Other:  Everitt Amber, [place] 7/4/20174:05 PM  Other:   7/4/20174:05 PM  Other:   7/4/20174:05 PM  Other:  7/4/20174:05 PM  Other:  7/4/20174:05 PM  Other:  7/4/20174:05 PM  Other:  7/4/20174:05 PM  Other:  7/4/20174:05 PM  Other:  7/4/20174:05 PM   Other:   7/4/20174:05 PM   Scribe for Treatment Team:   August Saucer, 06/15/2016, 4:05 PM, MSW, LCSW

## 2016-06-15 NOTE — Progress Notes (Signed)
Patient asked to speak to writer, upon approach, patient appears anxious, figerty  and restless, unable to sit, she rated her anxiety 10/10. Patient stated " her anxiety was because she don't  belong here and she dosen't know her treatment plan" writer explained her treatment plan and reassured  her. MD on call notified of her concerns and anxiety medication order was received, will continue to closely monitor.

## 2016-06-15 NOTE — Progress Notes (Signed)
Recreation Therapy Notes  At approximately 2:10 pm, LRT attempted assessment. Patient sleeping and would not wake up when name was called.  Jacquelynn CreeGreene,Hayleen Clinkscales M, LRT/CTRS 06/15/2016 2:15 PM

## 2016-06-15 NOTE — BHH Group Notes (Signed)
B[facility] Group Notes:  (Nursing/MHT/Case Management/Adjunct)  Date:  06/15/2016  Time:  3:57 AM  Type of Therapy:  Psychoeducational Skills  Participation Level:  Did Not Attend  Summary of Progress/Problems:  Carolyn MilroyLaquanda Y Lajuanna [name] 06/15/2016, 3:57 AM

## 2016-06-16 LAB — PROLACTIN: Prolactin: 57.1 ng/mL — ABNORMAL HIGH (ref 4.8–23.3)

## 2016-06-16 MED ORDER — OXCARBAZEPINE 150 MG PO TABS
150.0000 mg | ORAL_TABLET | Freq: Two times a day (BID) | ORAL | Status: DC
  Administered 2016-06-16 – 2016-06-17 (×3): 150 mg via ORAL
  Filled 2016-06-16 (×3): qty 1

## 2016-06-16 NOTE — Progress Notes (Signed)
D: Pt denies SI/HI/AVH. Pt is pleasant and cooperative, affect is flat and sad,  Thought are organized and logical no bizarre behavior noted. Patient isolates to the room, but appears less anxious, she is not interacting with peers.  A: Pt was offered support and encouragement. Pt was given scheduled medications. Pt was encouraged to attend groups. Q 15 minute checks were done for safety.  R:Pt did not attend evening group.Pt is taking medication. Pt has no complaints.Pt receptive to treatment and safety maintained on unit.

## 2016-06-16 NOTE — Progress Notes (Signed)
[facility] MD Progress Note  06/16/2016 11:03 AM Carolyn [name]  MRN:  [id number]  Subjective:  [name] feels slightly better today but is still depressed, and suicidal. She did not experience any auditory hallucinations this morning. She discloses that prior to coming to the hospital she relapsed on cocaine after 3 years of sobriety. She also told me that she goes to pain clinic in [place] for her back problems. She by her medicines in [place]. She has been treated with Neurontin here and does not appear to be in pain. She tolerates medications well although she seems somewhat sedated. Sleep and appetite are good. Good group participation.  Principal Problem: Major depressive disorder, recurrent, severe with psychotic features (HCC) Diagnosis:   Patient Active Problem List   Diagnosis Date Noted  . Suicidal ideation [R45.851] 06/15/2016  . Hypothyroidism [E03.9] 06/14/2016  . Major depressive disorder, recurrent, severe with psychotic features (HCC) [F33.3] 06/14/2016  . PTSD (post-traumatic stress disorder) [F43.10] 06/14/2016  . OCD (obsessive compulsive disorder) [F42.9] 06/14/2016  . 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 Cocaine use disorder, moderate, dependence (HCC) [F14.20] 06/14/2016   Total Time spent with patient: 20 minutes  Past Psychiatric History: Depression and anxiety.  Past Medical History:  Past Medical History  Diagnosis Date  . Thyroid disease   . Grave's disease   . Ovarian cyst   . Acute kidney injury (HCC)   . Migraines   . Depression   . Anxiety   . Bipolar 1 disorder [facility])     Past Surgical History  Procedure Laterality Date  . Thyroid surgery    . Fracture surgery    . Dilation and curettage of uterus    . Dilation and curettage of uterus  05/10/2012    Procedure: DILATATION AND CURETTAGE;  Surgeon: Tilda Burrow, MD;  Location: [facility];  Service: Gynecology;  Laterality: N/A;  suction  . Iud removal   05/10/2012    Procedure: INTRAUTERINE DEVICE (IUD) REMOVAL;  Surgeon: Tilda Burrow, MD;  Location: [facility];  Service: Gynecology;;  . Ankle surgery Left     pin put in  . Dilation and curettage of uterus N/A 06/19/2013    Procedure: DILATATION AND SUCTION CURETTAGE;  Surgeon: Tilda Burrow, MD;  Location: [facility];  Service: Gynecology;  Laterality: N/A;   Family History:  Family History  Problem Relation Age of Onset  . Anesthesia problems Neg Hx   . Hypotension Neg Hx   . Malignant hyperthermia Neg Hx   . Pseudochol deficiency Neg Hx   . Diabetes Mother   . Hypertension Mother   . Hyperlipidemia Mother   . Cancer Mother   . Hypertension Father   . Hyperlipidemia Father   . Cancer Maternal Grandmother     uterine   . Heart disease Maternal Grandfather    Family Psychiatric  History: See H&P. Social History:  History  Alcohol Use  . Yes    Comment: social     History  Drug Use  . Yes    Comment: used crack this past thursday    Social History   Social History  . Marital Status: Legally Separated    Spouse Name: N/A  . Number of Children: N/A  . Years of Education: N/A   Social History Main Topics  . Smoking status: Never Smoker   . Smokeless tobacco: Never Used  . Alcohol Use: Yes     Comment: social  .  Drug Use: Yes     Comment: used crack this past thursday  . Sexual Activity: Yes    Birth Control/ Protection: Injection   Other Topics Concern  . None   Social History Narrative   Additional Social History:                         Sleep: Fair  Appetite:  Fair  Current Medications: Current Facility-Administered Medications  Medication Dose Route Frequency Provider Last Rate Last Dose  . acetaminophen (TYLENOL) tablet 650 mg  650 mg Oral Q6H PRN Safa Derner B Markail Diekman, MD      . alum & mag hydroxide-simeth (MAALOX/MYLANTA) 200-200-20 MG/5ML suspension 30 mL  30 mL Oral Q4H PRN Marry Kusch B Shauntavia Brackin, MD      . chlorproMAZINE (THORAZINE) tablet  50 mg  50 mg Oral TID PRN Shari ProwsJolanta B Laxmi Choung, MD   50 mg at 06/16/16 0808  . docusate sodium (COLACE) capsule 100 mg  100 mg Oral BID Shari ProwsJolanta B Jariana Shumard, MD   100 mg at 06/16/16 0911  . gabapentin (NEURONTIN) tablet 600 mg  600 mg Oral TID Shari ProwsJolanta B Evelena Masci, MD   600 mg at 06/16/16 0911  . levothyroxine (SYNTHROID, LEVOTHROID) tablet 150 mcg  150 mcg Oral QAC breakfast Shari ProwsJolanta B Jesstin Studstill, MD   150 mcg at 06/16/16 0640  . magnesium hydroxide (MILK OF MAGNESIA) suspension 30 mL  30 mL Oral Daily PRN Oney Folz B Edelin Fryer, MD      . multivitamin with minerals tablet 1 tablet  1 tablet Oral Daily Shari ProwsJolanta B Verlon Pischke, MD   1 tablet at 06/16/16 0911  . OXcarbazepine (TRILEPTAL) tablet 150 mg  150 mg Oral BID Maleni Seyer B Ocie Tino, MD      . risperiDONE (RISPERDAL) tablet 2 mg  2 mg Oral BID Shari ProwsJolanta B Emmery Seiler, MD   2 mg at 06/16/16 0911  . traZODone (DESYREL) tablet 100 mg  100 mg Oral QHS PRN Shari ProwsJolanta B Karren Newland, MD   100 mg at 06/14/16 2134  . venlafaxine XR (EFFEXOR-XR) 24 hr capsule 150 mg  150 mg Oral Q breakfast Saba Gomm B Gamal Todisco, MD   150 mg at 06/16/16 0807  . [START ON 06/21/2016] Vitamin D (Ergocalciferol) (DRISDOL) capsule 50,000 Units  50,000 Units Oral Weekly Shari ProwsJolanta B Jovanka Westgate, MD        Lab Results:  Results for orders placed or performed during the hospital encounter of 06/14/16 (from the past 48 hour(s))  Hemoglobin A1c     Status: None   Collection Time: 06/15/16  6:54 AM  Result Value Ref Range   Hgb A1c MFr Bld 5.4 4.0 - 6.0 %  Lipid panel, fasting     Status: Abnormal   Collection Time: 06/15/16  6:54 AM  Result Value Ref Range   Cholesterol 161 0 - 200 mg/dL   Triglycerides 782103 <956<150 mg/dL   HDL 40 (L) >21>40 mg/dL   Total CHOL/HDL Ratio 4.0 RATIO   VLDL 21 0 - 40 mg/dL   LDL Cholesterol 308100 (H) 0 - 99 mg/dL    Comment:        Total Cholesterol/HDL:CHD Risk Coronary Heart Disease Risk Table                     Men   Women  1/2 Average Risk   3.4   3.3   Average Risk       5.0   4.4  2 X Average Risk   9.6  7.1  3 X Average Risk  23.4   11.0        Use the calculated Patient Ratio above and the CHD Risk Table to determine the patient's CHD Risk.        ATP III CLASSIFICATION (LDL):  <100     mg/dL   Optimal  161-096100-129  mg/dL   Near or Above                    Optimal  130-159  mg/dL   Borderline  045-409160-189  mg/dL   High  >811>190     mg/dL   Very High   Prolactin     Status: Abnormal   Collection Time: 06/15/16  6:54 AM  Result Value Ref Range   Prolactin 57.1 (H) 4.8 - 23.3 ng/mL    Comment: (NOTE) Performed At: [facility] [address] [place], [place] [id number] Mila HomerHancock William F MD OZ:[id number]:[phone]     Blood Alcohol level:  Lab Results  Component Value Date   [facility] 06/13/2016    Metabolic Disorder Labs: Lab Results  Component Value Date   HGBA1C 5.4 06/15/2016   Lab Results  Component Value Date   PROLACTIN 57.1* 06/15/2016   Lab Results  Component Value Date   CHOL 161 06/15/2016   TRIG 103 06/15/2016   HDL 40* 06/15/2016   CHOLHDL 4.0 06/15/2016   VLDL 21 06/15/2016   LDLCALC 100* 06/15/2016    Physical Findings: AIMS: Facial and Oral Movements Muscles of Facial Expression: None, normal Lips and Perioral Area: None, normal Jaw: None, normal Tongue: None, normal,Extremity Movements Upper (arms, wrists, hands, fingers): None, normal Lower (legs, knees, ankles, toes): None, normal, Trunk Movements Neck, shoulders, hips: None, normal, Overall Severity Severity of abnormal movements (highest score from questions above): None, normal Incapacitation due to abnormal movements: None, normal Patient's awareness of abnormal movements (rate only patient's report): No Awareness, Dental Status Current problems with teeth and/or dentures?: No Does patient usually wear dentures?: No  CIWA:  CIWA-Ar Total: 3 COWS:  COWS Total Score: 1  Musculoskeletal: Strength & Muscle Tone: within normal limits Gait &  Station: normal Patient leans: N/A  Psychiatric Specialty Exam: Physical Exam  Nursing note and vitals reviewed.   Review of Systems  Psychiatric/Behavioral: Positive for depression, suicidal ideas and hallucinations. The patient is nervous/anxious.   All other systems reviewed and are negative.   Blood pressure 117/75, pulse 72, temperature 98.7 F (37.1 C), temperature source Oral, resp. rate 18, height 5\' 7"  (1.702 m), weight 91.853 kg (202 lb 8 oz), SpO2 100 %.Body mass index is 31.71 kg/(m^2).  General Appearance: Casual  Eye Contact:  Good  Speech:  Clear and Coherent  Volume:  Normal  Mood:  Depressed and Worthless  Affect:  Blunt  Thought Process:  Goal Directed  Orientation:  Full (Time, Place, and Person)  Thought Content:  Hallucinations: Auditory  Suicidal Thoughts:  Yes.  with intent/plan  Homicidal Thoughts:  No  Memory:  Immediate;   Fair Recent;   Fair Remote;   Fair  Judgement:  Impaired  Insight:  Shallow  Psychomotor Activity:  Decreased  Concentration:  Concentration: Fair and Attention Span: Fair  Recall:  FiservFair  Fund of Knowledge:  Fair  Language:  Fair  Akathisia:  No  Handed:  Right  AIMS (if indicated):     Assets:  Communication Skills Desire for Improvement Financial Resources/Insurance Housing Intimacy Physical Health Resilience Social Support Transportation  ADL's:  Intact  Cognition:  WNL  Sleep:  Number of Hours: 8     Treatment Plan Summary: Daily contact with patient to assess and evaluate symptoms and progress in treatment and Medication management   [name] is a 30 year old female with a history of depression, anxiety, psychosis, mood instability and substance use admitted for suicidal ideation and auditory command hallucinations in the context of treatment noncompliance and social stressors.  1. Suicidal ideation. The patient is able to contract for safety in the hospital.  2. Mood. She was started on Risperdal for  psychosis and Effexor for depression and anxiety. We will add Trileptal for mood stabilization.  3. Hypothyroidism. We will continue Synthroid.  4. Insomnia. She is on trazodone.   5. Vitamin D deficiency. We'll continue supplementation.  6. Back pain. She is on Neurontin. No controlled substances will be prescribed.  7. Metabolic syndromes screening. Lipid panel, hemoglobin A1c and prolactin are pending.  8. Substance abuse. He is positive for cocaine but denies substance use. He is not interested in substance abuse treatment.  9. Agitation. Resolved.   10. Disposition. She will be discharged to home with her boyfriend. Follow up with [facility].    Kristine Linea, MD 06/16/2016, 11:03 AM He has been otherwise well she is that she found with associated and okay I will check with very little sense as we will not order

## 2016-06-16 NOTE — Plan of Care (Signed)
Problem: [facility] Participation in Recreation Therapeutic Interventions Goal: STG-Patient will demonstrate improved self esteem by identif STG: Self-Esteem - Within 3 treatment sessions, patient will verbalize at least 5 positive affirmation statements in one treatment session to increase self-esteem post d/c.  Outcome: Completed/Met Date Met:  06/16/16 Treatment Session 1; Completed 1 out of 1: At approximately 10:25 am, LRT met with patient in patient room. Patient verbalized 5 positive affirmation statements. Patient reported it felt "good". LRT encouraged patient to continue saying positive affirmation statements.  Leonette Monarch, LRT/CTRS 07.05.17 11:11 am Goal: STG-Other Recreation Therapy Goal (Specify) STG: Stress Management - Within 3 treatment sessions, patient will verbalize understanding of the stress management techniques in one treatment session to increase stress management skills post d/c.  Outcome: Completed/Met Date Met:  06/16/16 Treatment Session 1; Completed 1 out of 1: At approximately 10:15 am, LRT met with patient in patient room. LRT educated and provided patient with handouts on stress management techniques. Patient verbalized understanding. LRT encouraged patient to read over and practice the stress management techniques.  Leonette Monarch, LRT/CTRS 07.05.17 11:12 am

## 2016-06-16 NOTE — [facility] Group Notes (Signed)
[facility] LCSW Group Therapy   06/16/2016  9:30am   Type of Therapy: Group Therapy   Participation Level: Active   Participation Quality: Attentive, Sharing and Supportive   Affect: Depressed and Flat   Cognitive: Alert and Oriented   Insight: Developing/Improving and Engaged   Engagement in Therapy: Developing/Improving and Engaged   Modes of Intervention: Clarification, Confrontation, Discussion, Education, Exploration, Limit-setting, Orientation, Problem-solving, Rapport Building, Dance movement psychotherapisteality Testing, Socialization and Support   Summary of Progress/Problems: The topic for group today was emotional regulation. This group focused on both positive and negative emotion identification and allowed group members to process ways to identify feelings, regulate negative emotions, and find healthy ways to manage internal/external emotions. Group members were asked to reflect on a time when their reaction to an emotion led to a negative outcome and explored how alternative responses using emotion regulation would have benefited them. Group members were also asked to discuss a time when emotion regulation was utilized when a negative emotion was experienced. Pt shared that the pt identifies negative and positive emotions and then puts her headphones on and walk for exercise and this assists the pt. in coping with anxiety.  Pt shared that once when the pt became overwhelmed the pt went on a walk and this assisted the pt in "blanking out" intrusive thoughts and feelings and the pt was able to concentrate easily the rest of the day. Pt was polite and cooperative with the CSW and other group members and focused and attentive to the topics discussed and the sharing of others.    Dorothe PeaJonathan F. Karli Wickizer, MSW, LCSWA, LCAS

## 2016-06-16 NOTE — [facility] Group Notes (Signed)
[facility] Group Notes:  (Nursing/MHT/Case Management/Adjunct)  Date:  06/16/2016  Time:  2:40 AM  Type of Therapy:  Group Therapy  Participation Level:  Did Not Attend   Summary of Progress/Problems:  Carolyn [name] 06/16/2016, 2:40 AM

## 2016-06-16 NOTE — Progress Notes (Signed)
Recreation Therapy Notes  INPATIENT RECREATION TR PLAN  Patient Details Name: Carolyn [name] MRN: [id number] DOB: 1985-12-30 Today's Date: 06/16/2016  Rec Therapy Plan Is patient appropriate for Therapeutic Recreation?: Yes Treatment times per week: At least once a week TR Treatment/Interventions: 1:1 session, Group participation (Comment) (Appropriate participation in daily recreational therapy tx)  Discharge Criteria Pt will be discharged from therapy if:: Treatment goals are met, Discharged Treatment plan/goals/alternatives discussed and agreed upon by:: Patient/family  Discharge Summary Short term goals set: See Care Plan Short term goals met: Complete Progress toward goals comments: One-to-one attended One-to-one attended: Self-esteem, stress management Reason goals not met: N/A Therapeutic equipment acquired: None Reason patient discharged from therapy: Treatment goals met Pt/family agrees with progress & goals achieved: Yes Date patient discharged from therapy: 06/16/16   Leonette Monarch, LRT/CTRS 06/16/2016, 4:34 PM

## 2016-06-16 NOTE — [facility] Group Notes (Signed)
[facility] Group Notes:  (Nursing/MHT/Case Management/Adjunct)  Date:  06/16/2016  Time:  6:20 PM  Type of Therapy:  Psychoeducational Skills  Participation Level:  Did Not Attend   Karsten Vaughn Travis Finola Rosal 06/16/2016, 6:20 PM 

## 2016-06-16 NOTE — Progress Notes (Signed)
Recreation Therapy Notes  Date: 07.05.17 Time: 1:00 pm Location: Craft Room  Group Topic: Self-esteem  Goal Area(s) Addresses:  Patient will identify at least one positive trait about self. Patient will identity at least one healthy coping skill.  Behavioral Response: Did not attend  Intervention: All About Me  Activity: Patients were instructed to make an All About Me pamphlet with their life's motto, positive traits, healthy coping skills, and their support system.  Education: LRT educated patients on ways they can increase their self-esteem.  Education Outcome: Patient did not attend group.  Clinical Observations/Feedback: Patient did not attend group.  Jacquelynn CreeGreene,Angelgabriel Willmore M, LRT/CTRS 06/16/2016 2:46 PM
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
## 2016-06-17 ENCOUNTER — Other Ambulatory Visit: Payer: Self-pay | Admitting: Psychiatry

## 2016-06-17 MED ORDER — TRAZODONE HCL 100 MG PO TABS: 100.0000 mg | ORAL_TABLET | Freq: Every evening | ORAL | Status: AC | PRN

## 2016-06-17 MED ORDER — RISPERIDONE 2 MG PO TABS: 2.0000 mg | ORAL_TABLET | Freq: Every day | ORAL | Status: AC

## 2016-06-17 MED ORDER — VENLAFAXINE HCL ER 150 MG PO CP24: 150.0000 mg | ORAL_CAPSULE | Freq: Every day | ORAL | Status: AC

## 2016-06-17 MED ORDER — OXCARBAZEPINE 150 MG PO TABS: 150.0000 mg | ORAL_TABLET | Freq: Two times a day (BID) | ORAL | Status: AC

## 2016-06-17 MED ORDER — RISPERIDONE 1 MG PO TABS: 2.0000 mg | ORAL_TABLET | Freq: Every day | ORAL | Status: DC

## 2016-06-17 NOTE — Tx Team (Signed)
Interdisciplinary Treatment Plan Update (Adult)  Date:  06/17/2016 Time Reviewed:  11:21 AM  Progress in Treatment: Attending groups: Yes. Participating in groups:  Yes. Taking medication as prescribed:  Yes. Tolerating medication:  Yes. Family/Significant othe contact made:  Yes, individual(s) contacted:  boyfriend  Patient understands diagnosis:  Yes. Discussing patient identified problems/goals with staff:  Yes. Medical problems stabilized or resolved:  Yes. Denies suicidal/homicidal ideation: Yes. Issues/concerns per patient self-inventory:  Yes. Other:  New problem(s) identified: No, Describe:  NA  Discharge Plan or Barriers: Pt plans to return home and follow up with outpatient.    Reason for Continuation of Hospitalization: Anxiety Depression Medication stabilization Suicidal ideation  Comments: Carolyn [name] feels slightly better today. She did not experience any auditory hallucinations this morning. She discloses that prior to coming to the hospital she relapsed on cocaine after 3 years of sobriety. She also told me that she goes to pain clinic in [place] for her back problems. She by her medicines in [place]. She has been treated with Neurontin here and does not appear to be in pain. She tolerates medications well although she seems somewhat sedated. Sleep and appetite are good. Good group participation.  Estimated length of stay: Pt will likely d/c today.   New goal(s): NA  Review of initial/current patient goals per problem list:   1.  Goal(s): Patient will participate in aftercare plan * Met: Yes * Target date: at discharge * As evidenced by: Patient will participate within aftercare plan AEB aftercare provider and housing plan at discharge being identified.   2.  Goal (s): Patient will exhibit decreased depressive symptoms and suicidal ideations. * Met: Yes *  Target date: at discharge * As evidenced by: Patient will utilize self rating of depression at 3  or below and demonstrate decreased signs of depression or be deemed stable for discharge by MD.   3.  Goal(s): Patient will demonstrate decreased signs and symptoms of anxiety. * Met: Yes * Target date: at discharge * As evidenced by: Patient will utilize self rating of anxiety at 3 or below and demonstrated decreased signs of anxiety, or be deemed stable for discharge by MD    Attendees: Patient:  Carolyn [name] 7/6/201711:21 AM  Family:   7/6/201711:21 AM  Physician:   Dr. Bary Leriche  7/6/201711:21 AM  Nursing:   Polly Cobia, RN  7/6/201711:21 AM  Case Manager:   7/6/201711:21 AM  Counselor:   7/6/201711:21 AM  Other:  Wray Kearns, [place] 7/6/201711:21 AM  Other:   7/6/201711:21 AM  Other:   7/6/201711:21 AM  Other:  7/6/201711:21 AM  Other:  7/6/201711:21 AM  Other:  7/6/201711:21 AM  Other:  7/6/201711:21 AM  Other:  7/6/201711:21 AM  Other:  7/6/201711:21 AM  Other:   7/6/201711:21 AM   Scribe for Treatment Team:   Wray Kearns, MSW, [place]  06/17/2016, 11:21 AM

## 2016-06-17 NOTE — [facility] Suicide Risk Assessment (Signed)
[facility]   Principal Problem: Major depressive disorder, recurrent, severe with psychotic features [facility](HCC) Discharge Diagnoses:  Patient Active Problem List   Diagnosis Date Noted  . Suicidal ideation [R45.851] 06/15/2016  . Hypothyroidism [E03.9] 06/14/2016  . Major depressive disorder, recurrent, severe with psychotic features (HCC) [F33.3] 06/14/2016  . PTSD (post-traumatic stress disorder) [F43.10] 06/14/2016  . OCD (obsessive compulsive disorder) [F42.9] 06/14/2016  . 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 Cocaine use disorder, moderate, dependence (HCC) [F14.20] 06/14/2016    Total Time spent with patient: 30 minutes  Musculoskeletal: Strength & Muscle Tone: within normal limits Gait & Station: normal Patient leans: N/A  Psychiatric Specialty Exam: Review of Systems  All other systems reviewed and are negative.   Blood pressure 121/79, pulse 66, temperature 98.3 F (36.8 C), temperature source Oral, resp. rate 18, height 5\' 7"  (1.702 m), weight 91.853 kg (202 lb 8 oz), SpO2 100 %.Body mass index is 31.71 kg/(m^2).  General Appearance: Casual  Eye Contact::  Good  Speech:  Clear and Coherent409  Volume:  Normal  Mood:  Euthymic  Affect:  Appropriate  Thought Process:  Goal Directed  Orientation:  Full (Time, Place, and Person)  Thought Content:  WDL  Suicidal Thoughts:  No  Homicidal Thoughts:  No  Memory:  Immediate;   Fair Recent;   Fair Remote;   Fair  Judgement:  Impaired  Insight:  Present  Psychomotor Activity:  Normal  Concentration:  Fair  Recall:  [employer]Fair  Fund of Knowledge:Fair  Language: Fair  Akathisia:  No  Handed:  Right  AIMS (if indicated):     Assets:  Communication Skills Desire for Improvement Financial Resources/Insurance Housing Intimacy Physical Health Resilience Social Support Transportation  Sleep:  Number of Hours: 7  Cognition: WNL  ADL's:  Intact   Mental  Status Per Nursing Assessment::   On Admission:  Self-harm thoughts  Demographic Factors:  Adolescent or young adult, Caucasian and Unemployed  Loss Factors: NA  Historical Factors: Prior suicide attempts and Impulsivity  Risk Reduction Factors:   Responsible for children under 29 years of age, Sense of responsibility to family, Living with another person, especially a relative and Positive social support  Continued Clinical Symptoms:  Depression:   Comorbid alcohol abuse/dependence Impulsivity Alcohol/Substance Abuse/Dependencies  Cognitive Features That Contribute To Risk:  None    Suicide Risk:  Minimal: No identifiable suicidal ideation.  Patients presenting with no risk factors but with morbid ruminations; may be classified as minimal risk based on the severity of the depressive symptoms  Follow-up Information    Follow up with [facility] On 06/18/2016.   Why:  Your hospital follow up appointment in on Friday July 7th at 7:00am with Lorella NimrodHarvey. Please call Lorella NimrodHarvey at [phone].    Contact information:   [address] [place] [address] [phone]       Plan Of Care/Follow-up recommendations:  Activity:  As tolerated. Diet:  Low sodium heart healthy. Other:  Keep follow-up appointments.  Kristine LineaJolanta Ceri Mayer, MD 06/17/2016, 11:15 AM

## 2016-06-17 NOTE — Plan of Care (Signed)
Problem: Medication: Goal: Compliance with prescribed medication regimen will improve Outcome: Progressing Pt compliant with medication regimen.   

## 2016-06-17 NOTE — Progress Notes (Signed)
  [facility] Dis[facility] :  Will you be returning to the same living situation after discharge:  Yes,  home  At discharge, do you have transportation home?: Yes,  Boyfriend  Do you have the ability to pay for your medications: Yes,  insurance   Release of information consent forms completed and in the chart;  Patient's signature needed at discharge.  Patient to Follow up at: Follow-up Information    Follow up with [facility] On 06/18/2016.   Why:  Your hospital follow up appointment in on Friday July 7th at 7:00am with Lorella NimrodHarvey. Please call Lorella NimrodHarvey at [phone].    Contact information:   [address] [place] [address] [phone]       Next level of care provider [facility]:yes  Safety Planning and Suicide Prevention discussed: Yes,  with patient and boyfriend   Have you used any form of tobacco in the last 30 days? (Cigarettes, Smokeless Tobacco, Cigars, and/or Pipes): No  Has patient been referred to the Quitline?: Patient refused referral  Patient has been referred for addiction treatment: Yes  Rondall Allegraandace L Alethea Terhaar MSW, LCSWA  06/17/2016, 11:31 AM

## 2016-06-17 NOTE — Progress Notes (Signed)
D: Observed pt in room laying in bed. Patient alert and oriented x4. Patient denies SI/HI/AVH. Pt affect is sad and anxious. Pt isolative to room this evening and forwarded little. Pt did indicate that her day was "better than yesterday...didn't hear any voices or have panic attack." Pt indicated the groups are "a little helpful." Pt rated depression 6/10 and anxiety 6/10 and indicated it's "better" than when first admitted.  A: Offered active listening and support. Provided therapeutic communication. Administered scheduled medications. Encouraged pt to attend groups and actively participate in care.  R: Pt pleasant and cooperative. Pt refused Gabapentin stating "it makes me cloudy headed." Pt compliant with other medications. Will continue Q15 min. checks. Safety maintained.

## 2016-06-17 NOTE — Discharge Summary (Signed)
Physician Discharge Summary Note  Patient:  Carolyn [name] is an 30 y.o., female MRN:  [id number] DOB:  07-06-1986 Patient phone:  [phone] (home)  Patient address:   [address] [place] [address],  Total Time spent with patient: 30 minutes  Date of Admission:  06/14/2016 Date of Discharge: 06/17/2016  Reason for Admission:  Depression, psychosis, suicidal ideation.  Identifying data. Carolyn [name] is a 30 y.o. female with a history of depression, anxiety, psychosis, mood instability and substance use.  Chief complaint. "I think I might give in to my hallucinations."  History of present illness. Information was obtained from the patient and the chart. The patient has a long history of depression and mood instability with several prior hospitalizations. She came to the hospital complaining of mood instability, auditory command hallucination and suicidal ideation. She was briefly hospitalized on medical floor for severe hypothyroidism. She has been off her psychiatric medications for several years now. In the past couple of months became increasingly depressed. She believes that this is in association with the anniversary of losing her stepson died in an accident. It also coincided with her son's birthday. Upcoming for life quality made it even worse. The patient reports many symptoms of depression with poor sleep, decreased appetite, poor energy and concentration, social isolation, crying spells. She also developed hallucinations commanding her to hurt herself. She has no intention to attempt suicide but is tired of the voices and worried that she may give in. She reports profound mood swings with periods of depressed mood extremely low energy and high anxiety. They are interspace with brief periods of hyperactivity, insomnia, irritability. She reports many symptoms of depression with frequent panic attacks, social anxiety, PTSD symptoms stemming from losing her stepson with nightmares and  flashbacks and OCD symptoms. She denies alcohol or illicit substance use but is positive for cocaine.  Past psychiatric history. She had 2 prior psychiatric hospitalizations. There were no suicide attempts. She has not been under psychiatric care for several years. She was sober from cocaine for 3 years but relapsed recently. She has been a patient at the pain clinic when she is prescribed narcotic painkillers.  Family psychiatric history. Her father has severe anxiety and schizoaffective disorder. He is now on medications during better.  Social history. She lives with her boyfriend and 3 children ages 73, 44 and 65. 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 She has a history of severe hypothyroidism that is difficult to control. I would not rule out noncompliance.  Principal Problem: Major depressive disorder, recurrent, severe with psychotic features [facility]) Discharge Diagnoses: Patient Active Problem List   Diagnosis Date Noted  . Suicidal ideation [R45.851] 06/15/2016  . Hypothyroidism [E03.9] 06/14/2016  . Major depressive disorder, recurrent, severe with psychotic features (HCC) [F33.3] 06/14/2016  . PTSD (post-traumatic stress disorder) [F43.10] 06/14/2016  . OCD (obsessive compulsive disorder) [F42.9] 06/14/2016  . 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 Cocaine use disorder, moderate, dependence (HCC) [F14.20] 06/14/2016    Past Medical History:  Past Medical History  Diagnosis Date  . Thyroid disease   . Grave's disease   . Ovarian cyst   . Acute kidney injury (HCC)   . Migraines   . Depression   . Anxiety   . Bipolar 1 disorder [facility])     Past Surgical History  Procedure Laterality Date  . Thyroid surgery    . Fracture surgery    . Dilation and curettage of uterus    .  Dilation and curettage of uterus  05/10/2012    Procedure: DILATATION AND CURETTAGE;  Surgeon: Tilda BurrowJohn V Ferguson, MD;  Location: [facility];  Service: Gynecology;  Laterality: N/A;  suction   . Iud removal  05/10/2012    Procedure: INTRAUTERINE DEVICE (IUD) REMOVAL;  Surgeon: Tilda BurrowJohn V Ferguson, MD;  Location: [facility];  Service: Gynecology;;  . Ankle surgery Left     pin put in  . Dilation and curettage of uterus N/A 06/19/2013    Procedure: DILATATION AND SUCTION CURETTAGE;  Surgeon: Tilda BurrowJohn V Ferguson, MD;  Location: [facility];  Service: Gynecology;  Laterality: N/A;   Family History:  Family History  Problem Relation Age of Onset  . Anesthesia problems Neg Hx   . Hypotension Neg Hx   . Malignant hyperthermia Neg Hx   . Pseudochol deficiency Neg Hx   . Diabetes Mother   . Hypertension Mother   . Hyperlipidemia Mother   . Cancer Mother   . Hypertension Father   . Hyperlipidemia Father   . Cancer Maternal Grandmother     uterine   . Heart disease Maternal Grandfather     Social History:  History  Alcohol Use  . Yes    Comment: social     History  Drug Use  . Yes    Comment: used crack this past thursday    Social History   Social History  . Marital Status: Legally Separated    Spouse Name: N/A  . Number of Children: N/A  . Years of Education: N/A   Social History Main Topics  . Smoking status: Never Smoker   . Smokeless tobacco: Never Used  . Alcohol Use: Yes     Comment: social  . Drug Use: Yes     Comment: used crack this past thursday  . Sexual Activity: Yes    Birth Control/ Protection: Injection   Other Topics Concern  . None   Social History Narrative    Hospital Course:    [name] is a 30 year old female with a history of depression, anxiety, psychosis, mood instability and substance use admitted for suicidal ideation and auditory command hallucinations in the context of treatment noncompliance and social stressors.  1. Suicidal ideation. This has resolved. The patient is able to contract for safety. She is forward thinking and optimistic about the future. She is a loving mother.  2. Mood. She was started on Risperdal for psychosis, Effexor  for depression and anxiety, and Trileptal for mood stabilization.  3. Hypothyroidism. We continued Synthroid. TSH 60.   4. Insomnia. She was on trazodone.   5. Vitamin D deficiency. We continue supplementation.  6. Back pain. She was on Neurontin. No controlled substances were prescribed.  7. Metabolic syndromes screening. Lipid panel and hemoglobin A1c are normal. Prolactin 57.   8. Substance abuse. He is positive for cocaine but denies substance use. He is not interested in substance abuse treatment.  9. Agitation. Resolved.   10. Disposition. She was discharged to home with her boyfriend. She will follow up with [facility].  Physical Findings: AIMS: Facial and Oral Movements Muscles of Facial Expression: None, normal Lips and Perioral Area: None, normal Jaw: None, normal Tongue: None, normal,Extremity Movements Upper (arms, wrists, hands, fingers): None, normal Lower (legs, knees, ankles, toes): None, normal, Trunk Movements Neck, shoulders, hips: None, normal, Overall Severity Severity of abnormal movements (highest score from questions above): None, normal Incapacitation due to abnormal movements: None, normal Patient's awareness of abnormal movements (rate only patient's report):  No Awareness, Dental Status Current problems with teeth and/or dentures?: No Does patient usually wear dentures?: No  CIWA:  CIWA-Ar Total: 3 COWS:  COWS Total Score: 1  Musculoskeletal: Strength & Muscle Tone: within normal limits Gait & Station: normal Patient leans: N/A  Psychiatric Specialty Exam: Physical Exam  Nursing note and vitals reviewed.   Review of Systems  Psychiatric/Behavioral: Positive for substance abuse. The patient is nervous/anxious.   All other systems reviewed and are negative.   Blood pressure 121/79, pulse 66, temperature 98.3 F (36.8 C), temperature source Oral, resp. rate 18, height 5\' 7"  (1.702 m), weight 91.853 kg (202 lb 8 oz), SpO2 100 %.Body mass index is 31.71  kg/(m^2).  See SRA.                                                  Sleep:  Number of Hours: 7     Have you used any form of tobacco in the last 30 days? (Cigarettes, Smokeless Tobacco, Cigars, and/or Pipes): No  Has this patient used any form of tobacco in the last 30 days? (Cigarettes, Smokeless Tobacco, Cigars, and/or Pipes) Yes, Yes, A prescription for an FDA-approved tobacco cessation medication was offered at discharge and the patient refused  Blood Alcohol level:  Lab Results  Component Value Date   [facility] <5 06/13/2016    Metabolic Disorder Labs:  Lab Results  Component Value Date   HGBA1C 5.4 06/15/2016   Lab Results  Component Value Date   PROLACTIN 57.1* 06/15/2016   Lab Results  Component Value Date   CHOL 161 06/15/2016   TRIG 103 06/15/2016   HDL 40* 06/15/2016   CHOLHDL 4.0 06/15/2016   VLDL 21 06/15/2016   LDLCALC 100* 06/15/2016    See Psychiatric Specialty Exam and Suicide Risk Assessment completed by Attending Physician prior to discharge.  Discharge destination:  Home  Is patient on multiple antipsychotic therapies at discharge:  No   Has Patient had three or more failed trials of antipsychotic monotherapy by history:  No  Recommended Plan for Multiple Antipsychotic Therapies: NA  Discharge Instructions    Diet - low sodium heart healthy    Complete by:  As directed      Increase activity slowly    Complete by:  As directed             Medication List    TAKE these medications      Indication   DRISDOL 1610950000 units capsule  Generic drug:  ergocalciferol  Take 1 capsule by mouth once a week. Patient takes on Monday      gabapentin 600 MG tablet  Commonly known as:  NEURONTIN  Take 600 mg by mouth 3 (three) times daily.      Levothyroxine Sodium 150 MCG Caps  Commonly known as:  TIROSINT  Take 1 capsule (150 mcg total) by mouth daily before breakfast.   Indication:  Underactive Thyroid     multivitamin with  minerals tablet  Take 1 tablet by mouth daily.      OXcarbazepine 150 MG tablet  Commonly known as:  TRILEPTAL  Take 1 tablet (150 mg total) by mouth 2 (two) times daily.   Indication:  Manic-Depression     risperiDONE 2 MG tablet  Commonly known as:  RISPERDAL  Take 1 tablet (2 mg total) by mouth  at bedtime.  Start taking on:  06/18/2016   Indication:  Manic-Depression     traZODone 100 MG tablet  Commonly known as:  DESYREL  Take 1 tablet (100 mg total) by mouth at bedtime as needed for sleep.   Indication:  Trouble Sleeping     venlafaxine XR 150 MG 24 hr capsule  Commonly known as:  EFFEXOR-XR  Take 1 capsule (150 mg total) by mouth daily with breakfast.   Indication:  Major Depressive Disorder           Follow-up Information    Follow up with [facility] On 06/18/2016.   Why:  Your hospital follow up appointment in on Friday July 7th at 7:00am with Lorella Nimrod. Please call Lorella Nimrod at [phone].    Contact information:   [address] [place] [address] [phone]       Follow-up recommendations:  Activity:  As tolerated. Diet:  Regular. Other:  Keep follow-up appointments.  Comments:    Signed: Kristine Linea, MD 06/17/2016, 11:18 AM

## 2016-06-17 NOTE — Progress Notes (Signed)
D:Patient aware of discharge this shift . Patient returning home . Patient received all belonging locked up . Patient denies  Suicidal  And homicidal ideations  .  A: Writer instructed on discharge criteria  .Patient given  Discharge summary  AVS and Suicide Risk Assesment and  prescriptions  . Aware  Of follow up appointment . R: Patient left unit with no questions  Or concerns  With friend .

## 2016-06-28 IMAGING — CT CT ABD-PELV W/O CM
2 of 4 series · 16 of 46 positions shown, 18 images · non-contrast
Comparison: 02/04/2005.

CLINICAL DATA: Right lower quadrant pain with fever and nausea.

EXAM:
CT ABDOMEN AND PELVIS WITHOUT CONTRAST
TECHNIQUE: Multidetector CT imaging of the abdomen and pelvis was performed
following the standard protocol without IV contrast.

[Series 2: standard/full over (age)lbs 5.0 · axial · 0.71mm/px · z∈[-444,+6]mm · 13 of 98 slices shown, 15 images]
[im 4/98  soft-tissue]
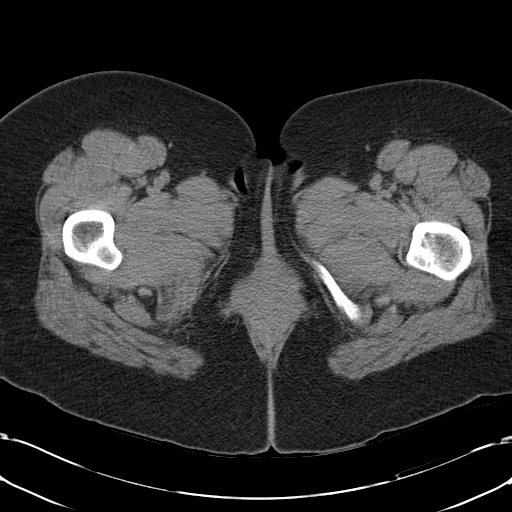
[im 4/98  bone]
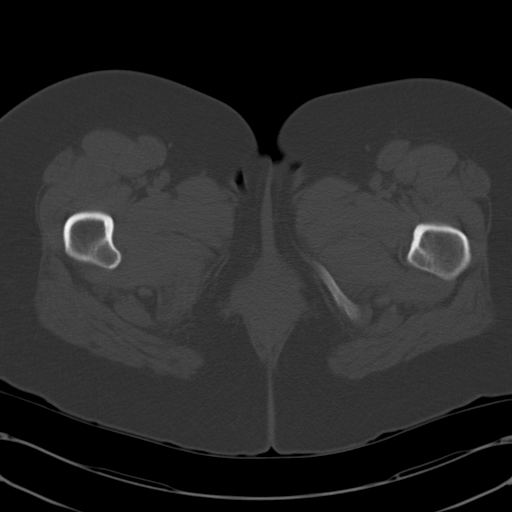
[im 12/98  soft-tissue]
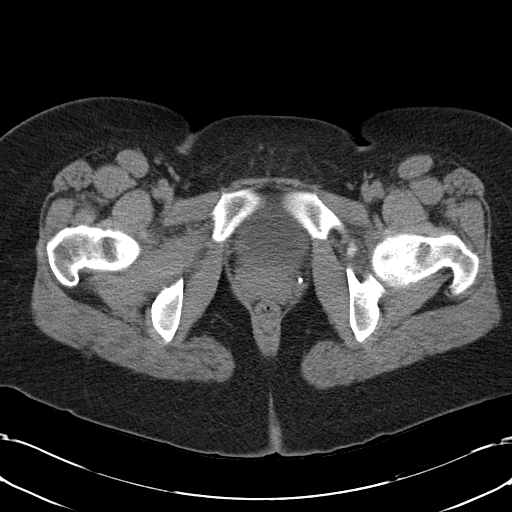
[im 20/98  soft-tissue]
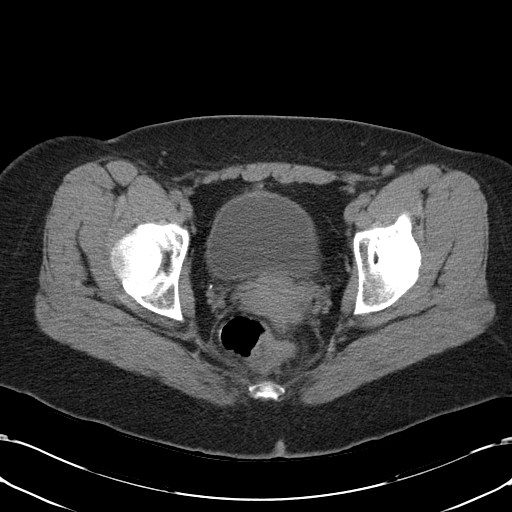
[im 28/98  soft-tissue]
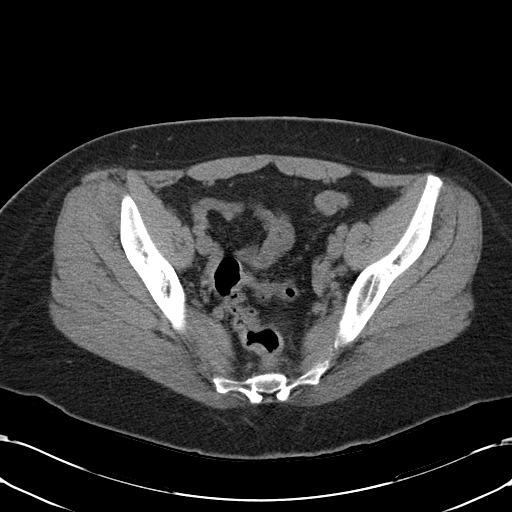
[im 35/98  soft-tissue]
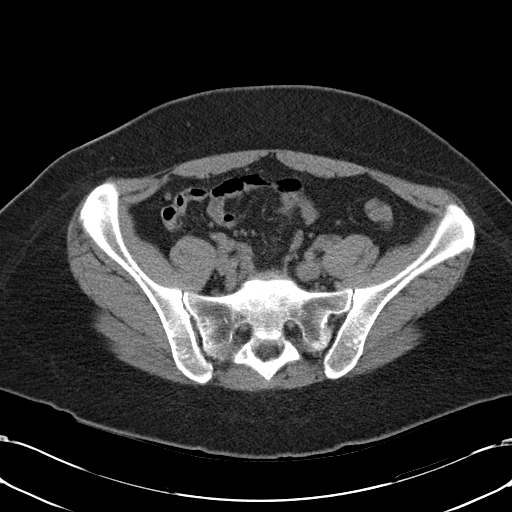
[im 43/98  soft-tissue]
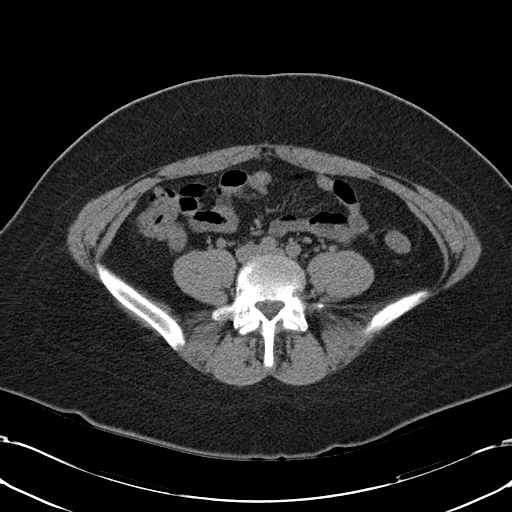
[im 51/98  soft-tissue]
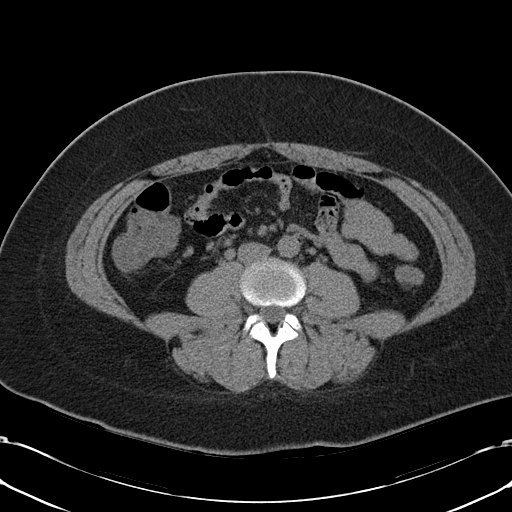
[im 55/98  soft-tissue]
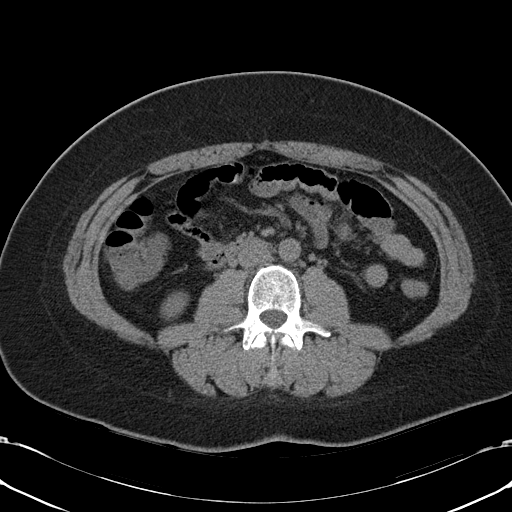
[im 63/98  soft-tissue]
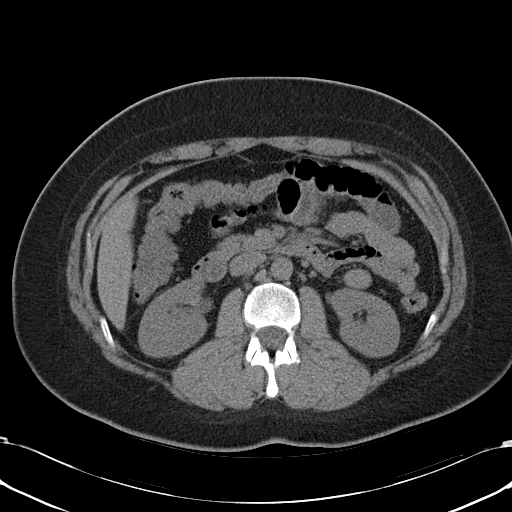
[im 63/98  bone]
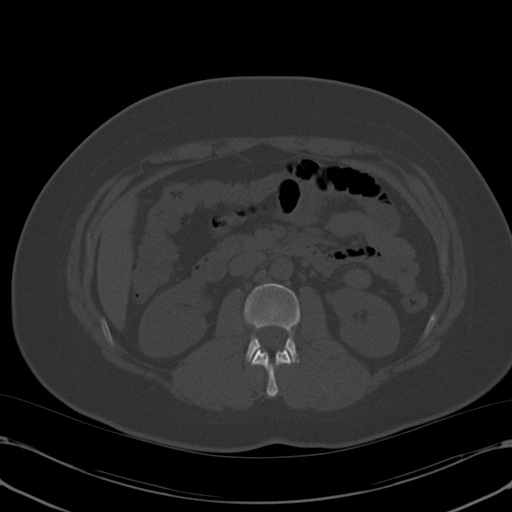
[im 70/98  soft-tissue]
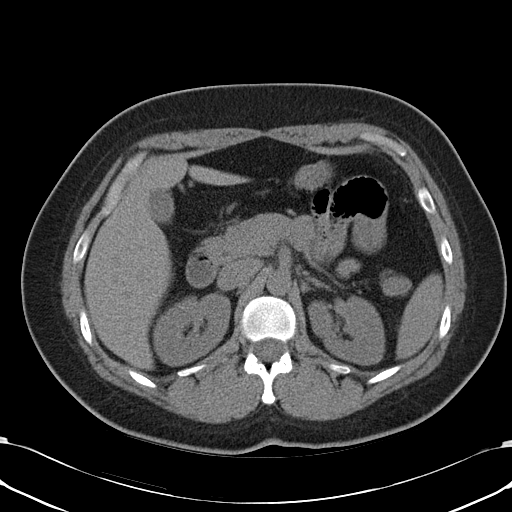
[im 78/98  soft-tissue]
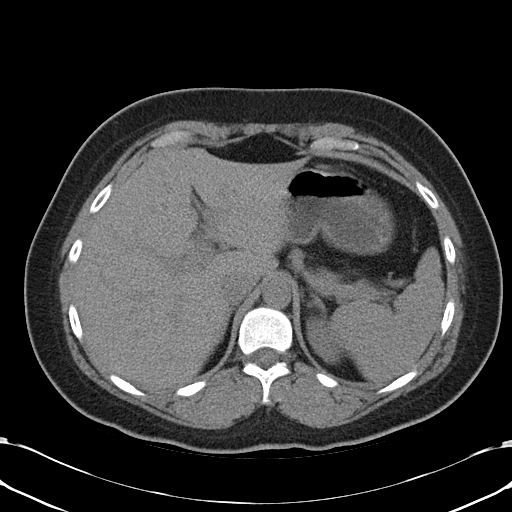
[im 86/98  soft-tissue]
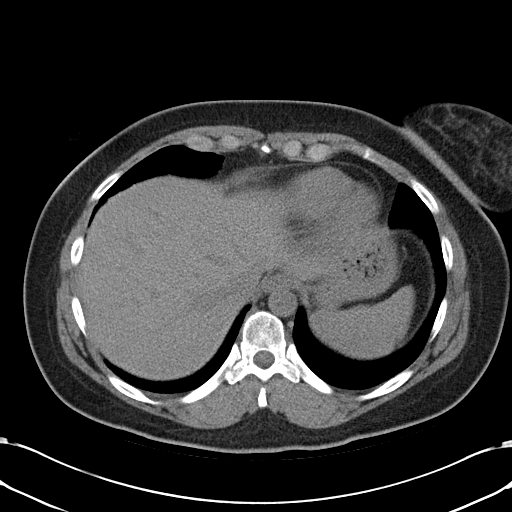
[im 94/98  soft-tissue]
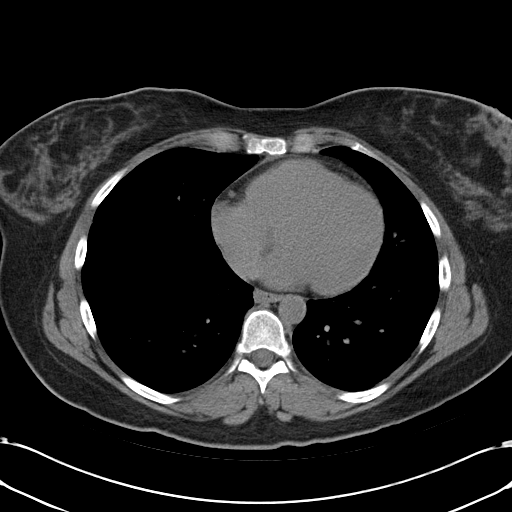

[Series 4: mpr coronal · coronal · 0.73mm/px · 3 of 85 slices shown]
[im 29/85  soft-tissue]
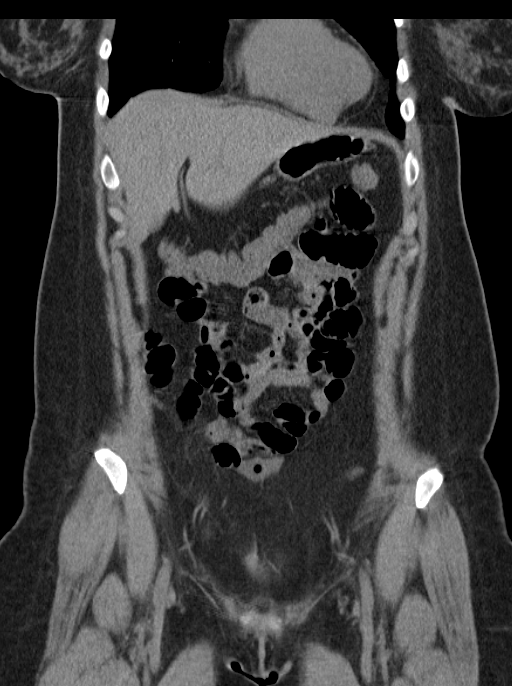
[im 38/85  soft-tissue]
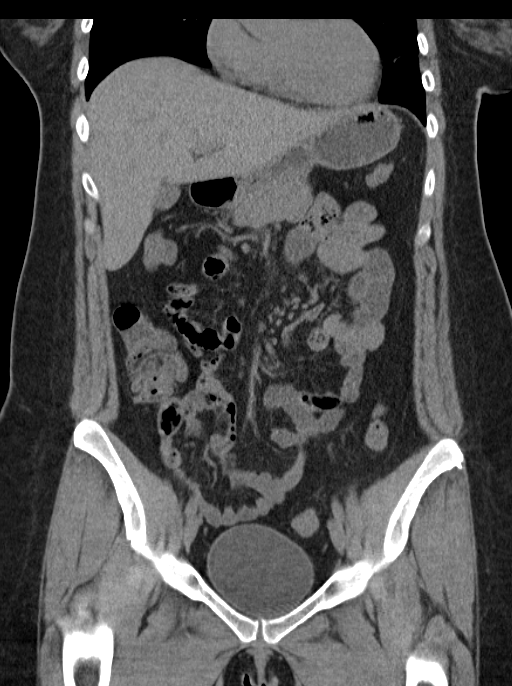
[im 47/85  soft-tissue]
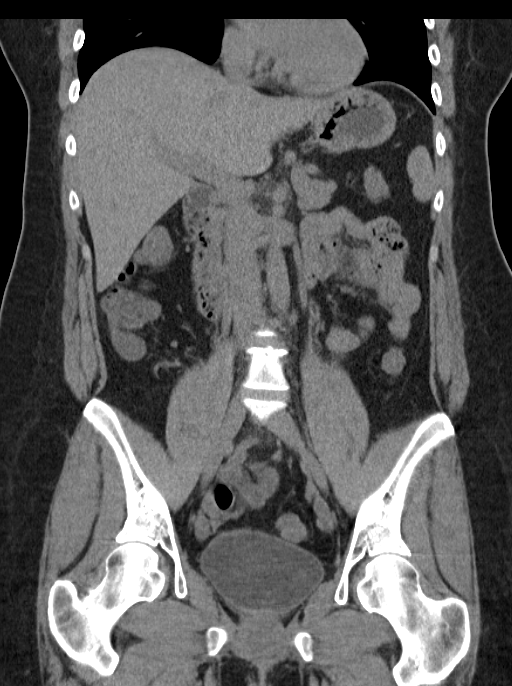

[16 of 46 positions shown; findings below may reference images not displayed]

FINDINGS: Lung bases show scattered nondependent peribronchovascular
ground-glass, primarily in the right middle lobe and lingula. Heart
size normal. No pericardial or pleural effusion.

Liver, gallbladder, adrenal glands, kidneys, spleen, pancreas,
stomach and bowel, including appendix, are unremarkable. No
pathologically enlarged lymph nodes. No free fluid. Uterus and
ovaries are visualized. Bladder is unremarkable.
IMPRESSION: 1. No acute findings to explain the patient's given symptoms. No
evidence of appendicitis.
2. Scattered peribronchovascular ground-glass in the right middle
lobe and lingula, likely infectious or inflammatory in etiology.

## 2016-08-11 ENCOUNTER — Ambulatory Visit: Payer: Medicaid Other | Admitting: "Endocrinology

## 2019-10-09 ENCOUNTER — Other Ambulatory Visit: Payer: Self-pay

## 2019-10-09 ENCOUNTER — Emergency Department: Payer: Medicaid Other

## 2019-10-09 ENCOUNTER — Encounter: Payer: Self-pay | Admitting: Emergency Medicine

## 2019-10-09 ENCOUNTER — Emergency Department
Admission: EM | Admit: 2019-10-09 | Discharge: 2019-10-09 | Disposition: A | Discharge status: Home / Self Care | Payer: Medicaid Other | Attending: Emergency Medicine | Admitting: Emergency Medicine

## 2019-10-09 DIAGNOSIS — E039 Hypothyroidism, unspecified: Secondary | ICD-10-CM | POA: Insufficient documentation

## 2019-10-09 DIAGNOSIS — R0789 Other chest pain: Secondary | ICD-10-CM | POA: Diagnosis present

## 2019-10-09 DIAGNOSIS — Z3202 Encounter for pregnancy test, result negative: Secondary | ICD-10-CM | POA: Diagnosis not present

## 2019-10-09 DIAGNOSIS — R079 Chest pain, unspecified: Secondary | ICD-10-CM

## 2019-10-09 DIAGNOSIS — Z79899 Other long term (current) drug therapy: Secondary | ICD-10-CM | POA: Diagnosis not present

## 2019-10-09 LAB — POCT PREGNANCY, URINE: Preg Test, Ur: NEGATIVE

## 2019-10-09 LAB — BASIC METABOLIC PANEL
Anion gap: 9 (ref 5–15)
BUN: 6 mg/dL (ref 6–20)
CO2: 24 mmol/L (ref 22–32)
Calcium: 9.6 mg/dL (ref 8.9–10.3)
Chloride: 108 mmol/L (ref 98–111)
Creatinine, Ser: 0.73 mg/dL (ref 0.44–1.00)
GFR calc Af Amer: >60 mL/min (ref >60)
GFR calc non Af Amer: >60 mL/min (ref >60)
Glucose, Bld: 98 mg/dL (ref 70–99)
Potassium: 3.8 mmol/L (ref 3.5–5.1)
Sodium: 141 mmol/L (ref 135–145)

## 2019-10-09 LAB — FIBRIN DERIVATIVES D-DIMER (ARMC ONLY): Fibrin derivatives D-dimer (ARMC): 502.83 ng/mL (FEU) — ABNORMAL HIGH (ref 0.00–499.00)

## 2019-10-09 LAB — CBC
HCT: 43.5 % (ref 36.0–46.0)
Hemoglobin: 14.2 g/dL (ref 12.0–15.0)
MCH: 27.6 pg (ref 26.0–34.0)
MCHC: 32.6 g/dL (ref 30.0–36.0)
MCV: 84.6 fL (ref 80.0–100.0)
Platelets: 197 10*3/uL (ref 150–400)
RBC: 5.14 MIL/uL — ABNORMAL HIGH (ref 3.87–5.11)
RDW: 13.1 % (ref 11.5–15.5)
WBC: 7.8 10*3/uL (ref 4.0–10.5)
nRBC: 0 % (ref 0.0–0.2)

## 2019-10-09 LAB — TSH: TSH: 1.532 u[IU]/mL (ref 0.350–4.500)

## 2019-10-09 LAB — TROPONIN I (HIGH SENSITIVITY)
Troponin I (High Sensitivity): <2 ng/L (ref <18)
Troponin I (High Sensitivity): 3 ng/L (ref <18)

## 2019-10-09 MED ORDER — IOHEXOL 350 MG/ML SOLN
100.0000 mL | Freq: Once | INTRAVENOUS | Status: AC | PRN
  Administered 2019-10-09: 100 mL via INTRAVENOUS
  Filled 2019-10-09: qty 100

## 2019-10-09 MED ORDER — SODIUM CHLORIDE 0.9 % IV BOLUS
1000.0000 mL | Freq: Once | INTRAVENOUS | Status: AC
  Administered 2019-10-09: 1000 mL via INTRAVENOUS

## 2019-10-09 MED ORDER — OXYCODONE HCL 5 MG PO TABS: 5.0000 mg | ORAL_TABLET | Freq: Four times a day (QID) | ORAL | 0 refills | Status: DC | PRN

## 2019-10-09 MED ORDER — OXYCODONE HCL 5 MG PO TABS: 5.0000 mg | ORAL_TABLET | Freq: Four times a day (QID) | ORAL | 0 refills | Status: AC | PRN

## 2019-10-09 MED ORDER — LIDOCAINE VISCOUS HCL 2 % MT SOLN
15.0000 mL | Freq: Once | OROMUCOSAL | Status: AC
Start: 2019-10-09 — End: 2019-10-09
  Administered 2019-10-09: 15 mL via OROMUCOSAL
  Filled 2019-10-09: qty 15

## 2019-10-09 MED ORDER — LORAZEPAM 2 MG/ML IJ SOLN
1.0000 mg | Freq: Once | INTRAMUSCULAR | Status: AC
  Administered 2019-10-09 (×2): 1 mg via INTRAVENOUS
  Filled 2019-10-09: qty 1

## 2019-10-09 MED ORDER — FENTANYL CITRATE (PF) 100 MCG/2ML IJ SOLN
50.0000 ug | Freq: Once | INTRAMUSCULAR | Status: AC
  Administered 2019-10-09: 50 ug via INTRAVENOUS
  Filled 2019-10-09: qty 2

## 2019-10-09 NOTE — ED Notes (Signed)
Patient transported to CT 

## 2019-10-09 NOTE — ED Notes (Signed)
Patient transported to X-ray 

## 2019-10-09 NOTE — ED Triage Notes (Signed)
Patient reports chest pain that started last night. Reports last night she had dizziness and nausea with one episode of vomiting as well. Patient reports pain slightly improved this morning. History of palpitations and bradycardia.

## 2019-10-09 NOTE — ED Provider Notes (Signed)
[facility] Emergency Department Provider Note   ____________________________________________   I have reviewed the triage vital signs and the nursing notes.   HISTORY  Chief Complaint Chest Pain   History limited by: Not Limited   HPI Carolyn [name] is a 33 y.o. female who presents to the emergency department today because of concern for chest pain and feeling unwell. The patient states that for the past week she has felt tired. Starting last night she developed chest discomfort. Described as a tightness it is located throughout her chest. The patient has had some nausea and dizziness associated with the discomfort. The patient says she has had similar symptoms in the past but never as severe. Denies any fevers.    Records reviewed. Per medical record review patient has a history of grave's disease.  Past Medical History:  Diagnosis Date  . Acute kidney injury (HCC)   . Anxiety   . Bipolar 1 disorder (HCC)   . Depression   . Grave's disease   . Migraines   . Ovarian cyst   . Thyroid disease     Patient Active Problem List   Diagnosis Date Noted  . Suicidal ideation 06/15/2016  . Hypothyroidism 06/14/2016  . Major depressive disorder, recurrent, severe with psychotic features (HCC) 06/14/2016  . PTSD (post-traumatic stress disorder) 06/14/2016  . OCD (obsessive compulsive disorder) 06/14/2016  . 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 Cocaine use disorder, moderate, dependence (HCC) 06/14/2016    Past Surgical History:  Procedure Laterality Date  . ANKLE SURGERY Left    pin put in  . DILATION AND CURETTAGE OF UTERUS    . DILATION AND CURETTAGE OF UTERUS  05/10/2012   Procedure: DILATATION AND CURETTAGE;  Surgeon: Tilda BurrowJohn V Ferguson, MD;  Location: [facility];  Service: Gynecology;  Laterality: N/A;  suction  . DILATION AND CURETTAGE OF UTERUS N/A 06/19/2013   Procedure: DILATATION AND SUCTION CURETTAGE;  Surgeon: Tilda BurrowJohn  V Ferguson, MD;  Location: [facility];  Service: Gynecology;  Laterality: N/A;  . FRACTURE SURGERY    . IUD REMOVAL  05/10/2012   Procedure: INTRAUTERINE DEVICE (IUD) REMOVAL;  Surgeon: Tilda BurrowJohn V Ferguson, MD;  Location: [facility];  Service: Gynecology;;  . THYROID SURGERY      Prior to Admission medications   Medication Sig Start Date End Date Taking? Authorizing Provider  ergocalciferol (DRISDOL) 50000 units capsule Take 1 capsule by mouth once a week. Patient takes on Monday 05/30/15   [provider]  gabapentin (NEURONTIN) 600 MG tablet Take 600 mg by mouth 3 (three) times daily. 01/02/16   [provider]  Levothyroxine Sodium (TIROSINT) 150 MCG CAPS Take 1 capsule (150 mcg total) by mouth daily before breakfast. 02/04/16   Nida, Denman GeorgeGebreselassie W, MD  Multiple Vitamins-Minerals (MULTIVITAMIN WITH MINERALS) tablet Take 1 tablet by mouth daily.    [provider]  OXcarbazepine (TRILEPTAL) 150 MG tablet Take 1 tablet (150 mg total) by mouth 2 (two) times daily. 06/17/16   Pucilowska, Jolanta B, MD  risperiDONE (RISPERDAL) 2 MG tablet Take 1 tablet (2 mg total) by mouth at bedtime. 06/18/16   Pucilowska, Braulio ConteJolanta B, MD  traZODone (DESYREL) 100 MG tablet Take 1 tablet (100 mg total) by mouth at bedtime as needed for sleep. 06/17/16   Pucilowska, Braulio ConteJolanta B, MD  venlafaxine XR (EFFEXOR-XR) 150 MG 24 hr capsule Take 1 capsule (150 mg total) by mouth daily with breakfast. 06/17/16   Pucilowska, Ellin GoodieJolanta B, MD  Allergies Aleve [naproxen sodium], Iodine, and Tylenol [acetaminophen]  Family History  Problem Relation Age of Onset  . Diabetes Mother   . Hypertension Mother   . Hyperlipidemia Mother   . Cancer Mother   . Hypertension Father   . Hyperlipidemia Father   . Cancer Maternal Grandmother        uterine   . Heart disease Maternal Grandfather   . Anesthesia problems Neg Hx   . Hypotension Neg Hx   . Malignant hyperthermia Neg Hx   . Pseudochol deficiency Neg Hx     Social  History Social History   Tobacco Use  . Smoking status: Never Smoker  . Smokeless tobacco: Never Used  Substance Use Topics  . Alcohol use: Yes    Comment: social  . Drug use: Not Currently    Comment: "clean for 3 years"    Review of Systems Constitutional: No fever/chills Eyes: No visual changes. ENT: No sore throat. Cardiovascular: Positive for chest tightness.  Respiratory: Denies shortness of breath. Gastrointestinal: No abdominal pain.  No nausea, no vomiting.  No diarrhea.   Genitourinary: Negative for dysuria. Musculoskeletal: Negative for back pain. Skin: Negative for rash. Neurological: Positive for dizziness.  ____________________________________________   PHYSICAL EXAM:  VITAL SIGNS: ED Triage Vitals  Enc Vitals Group     BP 10/09/19 1301 (!) 139/96     Pulse Rate 10/09/19 1256 62     Resp 10/09/19 1256 16     Temp 10/09/19 1256 98.6 F (37 C)     Temp Source 10/09/19 1256 Oral     SpO2 10/09/19 1256 99 %     Weight 10/09/19 1257 235 lb (106.6 kg)     Height 10/09/19 1257 5\' 7"  (1.702 m)     Head Circumference --      Peak Flow --      Pain Score 10/09/19 1257 6   Constitutional: Alert and oriented.  Eyes: Conjunctivae are normal.  ENT      Head: Normocephalic and atraumatic.      Nose: No congestion/rhinnorhea.      Mouth/Throat: Mucous membranes are moist.      Neck: No stridor. Hematological/Lymphatic/Immunilogical: No cervical lymphadenopathy. Cardiovascular: Normal rate, regular rhythm.  No murmurs, rubs, or gallops.  Respiratory: Normal respiratory effort without tachypnea nor retractions. Breath sounds are clear and equal bilaterally. No wheezes/rales/rhonchi. Gastrointestinal: Soft and non tender. No rebound. No guarding.  Genitourinary: Deferred Musculoskeletal: Normal range of motion in all extremities. No lower extremity edema. Neurologic:  Normal speech and language. No gross focal neurologic deficits are appreciated.  Skin:  Skin is  warm, dry and intact. No rash noted. Psychiatric: Mood and affect are normal. Speech and behavior are normal. Patient exhibits appropriate insight and judgment.  ____________________________________________    LABS (pertinent positives/negatives)  Trop hs <2 Upreg neg BMP wnl CBC wbc 7.8, hgb 14.2, plt 197 D-dimer 502 TSH 1.532 ____________________________________________   EKG  I, Nance Pear, attending physician, personally viewed and interpreted this EKG  EKG Time: 1251 Rate: 66 Rhythm: normal sinus rhythm Axis: normal Intervals: qtc 410 QRS: low voltage qrs ST changes: no st elevation Impression: abnormal ekg ____________________________________________    RADIOLOGY  CXR No acute abnormality  CT angio No PE. Pulmonary nodule.  ____________________________________________   PROCEDURES  Procedures  ____________________________________________   INITIAL IMPRESSION / ASSESSMENT AND PLAN / ED COURSE  Pertinent labs & imaging results that were available during my care of the patient were reviewed by me and considered  in my medical decision making (see chart for details).   Patient presented to the emergency department today because of concerns for chest pain. Differential would be broad. Broad work up was initiated including troponing, cxr. No obvious etiology on initial testing so d-dimer and tsh were added. D-dimer was elevated thus ct scan was ordered. Again no obvious etiology was found. At this point doubt PE/dissection/acs/pneumonia/ptx. Discussed nodule with patient. Will have patient follow up with PCP.  ____________________________________________   FINAL CLINICAL IMPRESSION(S) / ED DIAGNOSES  Final diagnoses:  Nonspecific chest pain     Note: This dictation was prepared with Dragon dictation. Any transcriptional errors that result from this process are unintentional     Phineas Semen, MD 10/09/19 2118

## 2019-10-09 NOTE — Discharge Instructions (Addendum)
Please seek medical attention for any high fevers, chest pain, shortness of breath, change in behavior, persistent vomiting, bloody stool or any other new or concerning symptoms.  

## 2019-10-09 NOTE — ED Notes (Signed)
Pt back to treatment room for Xray. NAD at this time. Pt resting in bed.

## 2019-10-09 NOTE — ED Notes (Signed)
MD at bedside.
